# Patient Record
Sex: Male | Born: 1965 | Race: White | Hispanic: No | Smoking: Former smoker
Health system: Southern US, Community
[De-identification: ages and names within clinical notes are randomized; demographics above are authoritative.]

## PROBLEM LIST (undated history)

## (undated) DIAGNOSIS — M4846XA Fatigue fracture of vertebra, lumbar region, initial encounter for fracture: Secondary | ICD-10-CM

## (undated) DIAGNOSIS — I10 Essential (primary) hypertension: Secondary | ICD-10-CM

## (undated) HISTORY — PX: FRACTURE SURGERY: SHX138

---

## 2009-08-28 ENCOUNTER — Emergency Department: Payer: Self-pay | Admitting: Emergency Medicine

## 2009-08-28 ENCOUNTER — Ambulatory Visit: Payer: Self-pay | Admitting: Family Medicine

## 2016-05-30 ENCOUNTER — Ambulatory Visit
Admission: EM | Admit: 2016-05-30 | Discharge: 2016-05-30 | Disposition: A | Discharge status: Home / Self Care | Payer: Federal, State, Local not specified - PPO | Attending: Family Medicine | Admitting: Family Medicine

## 2016-05-30 DIAGNOSIS — J42 Unspecified chronic bronchitis: Secondary | ICD-10-CM

## 2016-05-30 DIAGNOSIS — J209 Acute bronchitis, unspecified: Secondary | ICD-10-CM | POA: Diagnosis not present

## 2016-05-30 DIAGNOSIS — I10 Essential (primary) hypertension: Secondary | ICD-10-CM

## 2016-05-30 HISTORY — DX: Fatigue fracture of vertebra, lumbar region, initial encounter for fracture: M48.46XA

## 2016-05-30 HISTORY — DX: Essential (primary) hypertension: I10

## 2016-05-30 LAB — BASIC METABOLIC PANEL
Anion gap: 7 (ref 5–15)
BUN: 12 mg/dL (ref 6–20)
CALCIUM: 8.8 mg/dL — AB (ref 8.9–10.3)
CO2: 25 mmol/L (ref 22–32)
CREATININE: 0.84 mg/dL (ref 0.61–1.24)
Chloride: 99 mmol/L — ABNORMAL LOW (ref 101–111)
GFR calc non Af Amer: >60 mL/min (ref >60)
Glucose, Bld: 110 mg/dL — ABNORMAL HIGH (ref 65–99)
Potassium: 4.1 mmol/L (ref 3.5–5.1)
SODIUM: 131 mmol/L — AB (ref 135–145)

## 2016-05-30 MED ORDER — AZITHROMYCIN 250 MG PO TABS: ORAL_TABLET | ORAL | 0 refills | Status: DC

## 2016-05-30 MED ORDER — CLONIDINE HCL 0.1 MG PO TABS
0.1000 mg | ORAL_TABLET | Freq: Once | ORAL | Status: AC
  Administered 2016-05-30: 0.1 mg via ORAL

## 2016-05-30 MED ORDER — ALBUTEROL SULFATE HFA 108 (90 BASE) MCG/ACT IN AERS: 1.0000 | INHALATION_SPRAY | Freq: Four times a day (QID) | RESPIRATORY_TRACT | 0 refills | Status: DC | PRN

## 2016-05-30 MED ORDER — LISINOPRIL 10 MG PO TABS
10.0000 mg | ORAL_TABLET | Freq: Every day | ORAL | 0 refills | Status: DC
Start: 2016-05-30 — End: 2020-08-17

## 2016-05-30 MED ORDER — PREDNISONE 20 MG PO TABS: ORAL_TABLET | ORAL | 0 refills | Status: DC

## 2016-05-30 NOTE — ED Provider Notes (Signed)
[facility]    CSN: [id number] Arrival date & time: 05/30/16  1406     History   Chief Complaint Chief Complaint  Patient presents with  . Cough    HPI Christopher Beck is a 51 y.o. male.   The history is provided by the patient.  URI  Presenting symptoms: cough and fatigue   Presenting symptoms: no congestion, no ear pain, no facial pain, no fever, no rhinorrhea and no sore throat   Severity:  Moderate Onset quality:  Sudden Duration:  8 days Timing:  Constant Progression:  Worsening Chronicity:  New Relieved by:  Nothing Ineffective treatments:  OTC medications Associated symptoms: wheezing   Associated symptoms: no headaches, no neck pain and no sinus pain   Risk factors: sick contacts   Risk factors: not elderly, no chronic cardiac disease, no chronic kidney disease, no diabetes mellitus, no immunosuppression, no recent illness and no recent travel  Chronic respiratory disease: chronic smoker.     Past Medical History:  Diagnosis Date  . Hypertension   . Lumbar stress fracture     There are no active problems to display for this patient.   Past Surgical History:  Procedure Laterality Date  . FRACTURE SURGERY         Home Medications    Prior to Admission medications   Medication Sig Start Date End Date Taking? Authorizing Provider  buprenorphine-naloxone (SUBOXONE) 8-2 mg SUBL SL tablet Place 1 tablet under the tongue 2 (two) times daily.   Yes Historical Provider, MD  albuterol (PROVENTIL HFA;VENTOLIN HFA) 108 (90 Base) MCG/ACT inhaler Inhale 1-2 puffs into the lungs every 6 (six) hours as needed for wheezing or shortness of breath. 05/30/16   Payton Mccallum, MD  azithromycin (ZITHROMAX Z-PAK) 250 MG tablet 2 tabs po once day 1, then 1 tab po qd for next 4 days 05/30/16   Payton Mccallum, MD  lisinopril (PRINIVIL,ZESTRIL) 10 MG tablet Take 1 tablet (10 mg total) by mouth daily. 05/30/16   Payton Mccallum, MD  predniSONE (DELTASONE) 20 MG tablet 2  tabs po qd for 5 days 05/30/16   Payton Mccallum, MD    Family History No family history on file.  Social History Social History  Substance Use Topics  . Smoking status: Current Every Day Smoker    Types: Cigarettes  . Smokeless tobacco: Never Used  . Alcohol use No     Allergies   Patient has no known allergies.   Review of Systems Review of Systems  Constitutional: Positive for fatigue. Negative for fever.  HENT: Negative for congestion, ear pain, rhinorrhea, sinus pain and sore throat.   Respiratory: Positive for cough and wheezing.   Musculoskeletal: Negative for neck pain.  Neurological: Negative for headaches.     Physical Exam Triage Vital Signs ED Triage Vitals  Enc Vitals Group     BP 05/30/16 1425 (!) 188/100     Pulse Rate 05/30/16 1425 96     Resp 05/30/16 1425 18     Temp 05/30/16 1425 98.8 F (37.1 C)     Temp Source 05/30/16 1425 Oral     SpO2 05/30/16 1425 96 %     Weight 05/30/16 1426 125 lb (56.7 kg)     Height 05/30/16 1426  (1.651 m)     Head Circumference --      Peak Flow --      Pain Score 05/30/16 1426 2     Pain Loc --  Pain Edu? --      Excl. in [place]? --    No data found.   Updated Vital Signs BP (!) 180/100 (BP Location: Right Arm)   Pulse 96   Temp 98.8 F (37.1 C) (Oral)   Resp 18   Ht  (1.651 m)   Wt 125 lb (56.7 kg)   SpO2 96%   BMI 20.80 kg/m   Visual Acuity Right Eye Distance:   Left Eye Distance:   Bilateral Distance:    Right Eye Near:   Left Eye Near:    Bilateral Near:     Physical Exam  Constitutional: He appears well-developed and well-nourished. No distress.  HENT:  Head: Normocephalic and atraumatic.  Right Ear: Tympanic membrane, external ear and ear canal normal.  Left Ear: Tympanic membrane, external ear and ear canal normal.  Nose: Nose normal.  Mouth/Throat: Uvula is midline, oropharynx is clear and moist and mucous membranes are normal. No oropharyngeal exudate or tonsillar  abscesses.  Eyes: Conjunctivae and EOM are normal. Pupils are equal, round, and reactive to light. Right eye exhibits no discharge. Left eye exhibits no discharge. No scleral icterus.  Neck: Normal range of motion. Neck supple. No tracheal deviation present. No thyromegaly present.  Cardiovascular: Normal rate, regular rhythm and normal heart sounds.   Pulmonary/Chest: Effort normal. No stridor. No respiratory distress. He has wheezes (diffuse, bilaterally as well as diffuse rhonchi). He has no rales. He exhibits no tenderness.  Lymphadenopathy:    He has no cervical adenopathy.  Neurological: He is alert.  Skin: Skin is warm and dry. No rash noted. He is not diaphoretic.  Nursing note and vitals reviewed.    UC Treatments / Results  Labs (all labs ordered are listed, but only abnormal results are displayed) Labs Reviewed  BASIC METABOLIC PANEL - Abnormal; Notable for the following:       Result Value   Sodium 131 (*)    Chloride 99 (*)    Glucose, Bld 110 (*)    Calcium 8.8 (*)    All other components within normal limits    EKG  EKG Interpretation None       Radiology No results found.  Procedures Procedures (including critical care time)  Medications Ordered in UC Medications  cloNIDine (CATAPRES) tablet 0.1 mg (0.1 mg Oral Given 05/30/16 1518)     Initial Impression / Assessment and Plan / UC Course  I have reviewed the triage vital signs and the nursing notes.  Pertinent labs & imaging results that were available during my care of the patient were reviewed by me and considered in my medical decision making (see chart for details).       Final Clinical Impressions(s) / UC Diagnoses   Final diagnoses:  Chronic bronchitis with acute exacerbation [facility])  Essential hypertension    New Prescriptions Discharge Medication List as of 05/30/2016  3:48 PM    START taking these medications   Details  albuterol (PROVENTIL HFA;VENTOLIN HFA) 108 (90 Base) MCG/ACT  inhaler Inhale 1-2 puffs into the lungs every 6 (six) hours as needed for wheezing or shortness of breath., Starting Fri 05/30/2016, Normal    azithromycin (ZITHROMAX Z-PAK) 250 MG tablet 2 tabs po once day 1, then 1 tab po qd for next 4 days, Normal    lisinopril (PRINIVIL,ZESTRIL) 10 MG tablet Take 1 tablet (10 mg total) by mouth daily., Starting Fri 05/30/2016, Normal    predniSONE (DELTASONE) 20 MG tablet 2 tabs po qd for  5 days, Normal       1. Lab results and diagnosis reviewed with patient 2. rx as per orders above; reviewed possible side effects, interactions, risks and benefits  3. Patient given clonidine 0.1mg  po x 1 4.  Recommend supportive treatment with rest, fluids 5. Follow-up prn if symptoms worsen or don't improve 6. Set up follow up appointment with PCP for recheck blood pressure   Payton Mccallum, MD 05/30/16 1621

## 2016-05-30 NOTE — ED Triage Notes (Signed)
Pt c/o cough with congestion for the past 8 days. Pt states he has a hx of HTN but does not go to the doctor regularly and is not on medication, pt is HTN on arrival.

## 2018-08-31 ENCOUNTER — Telehealth: Payer: Self-pay | Admitting: *Deleted

## 2018-08-31 ENCOUNTER — Encounter: Payer: Self-pay | Admitting: Emergency Medicine

## 2018-08-31 ENCOUNTER — Ambulatory Visit
Admission: EM | Admit: 2018-08-31 | Discharge: 2018-08-31 | Disposition: A | Discharge status: Home / Self Care | Payer: Federal, State, Local not specified - PPO

## 2018-08-31 ENCOUNTER — Other Ambulatory Visit: Payer: Self-pay

## 2018-08-31 DIAGNOSIS — Z20822 Contact with and (suspected) exposure to covid-19: Secondary | ICD-10-CM

## 2018-08-31 DIAGNOSIS — Z20828 Contact with and (suspected) exposure to other viral communicable diseases: Secondary | ICD-10-CM

## 2018-08-31 NOTE — ED Triage Notes (Signed)
Pt states that he was in contact with 2 people that tested positive for COVID. This occurred about 2 days ago. He denies no cough, SOB, fever. He is completely asymptomatic.

## 2018-08-31 NOTE — Discharge Instructions (Addendum)
It was very nice seeing you today in clinic. Thank you for entrusting me with your care.   Someone will contact you today via telephone to arrange for COVID testing. It will be at [facility]. Wear your mask and remain in your car when you arrive. They will come out to you. Self quarantine, per [place] DHHS  guidelines, until negative result received.   Again, it was my pleasure to take care of you today. Thank you for choosing our clinic.  Honor Loh, MSN, APRN, FNP-C, CEN Advanced Practice Provider [facility]

## 2018-08-31 NOTE — Telephone Encounter (Signed)
Pt scheduled today for covid testing @ 11:45 @ [facility]. Instructions given and order placed

## 2018-08-31 NOTE — Telephone Encounter (Signed)
-----   Message from Karen Kitchens, NP sent at 08/31/2018 10:23 AM EDT ----- Needs COVID testing. He is here with [place] at [facility] now. If you will call us at [phone], we will be happy to provide patient with appointment details/directives.   Honor Loh, MSN, APRN, FNP-C, CEN Advanced Practice Provider [facility]

## 2018-08-31 NOTE — ED Provider Notes (Signed)
Name: Christopher Beck DOB: 1965/06/27 MRN: [id number] CSN: [id number] PCP: Medicine, [facility]  Arrival date and time:  08/31/18 0955  Chief Complaint:  COVID exsposure (APPT)   NOTE: Prior to seeing the patient today, I have reviewed the triage nursing documentation and vital signs. Clinical staff has updated patient's PMH/PSHx, current medication list, and drug allergies/intolerances to ensure comprehensive history available to assist in medical decision making.   History:   HPI: Christopher Beck is a 53 y.o. male who presents today with requests for SARS-CoV-2 (novel coronavirus) testing following close, in-door, exposure to 2 individuals who tested positive for the virus. Patient is just finding out today of the positive results. Exposure occurred on Sunday (08/29/2018). He notes that he was not wearing a mask or any other form of personal protective equipment. Patient presents today asymptomatic. Patient states, "I feel fine other than being worried about being exposed". He has not experienced any recent upper respiratory symptoms. Triage VS indicate that patient is afebrile.   Past Medical History:  Diagnosis Date  . Hypertension   . Lumbar stress fracture     Past Surgical History:  Procedure Laterality Date  . FRACTURE SURGERY      Family History  Problem Relation Age of Onset  . Healthy Mother   . Healthy Father     There are no active problems to display for this patient.   Home Medications:    Current Meds  Medication Sig  . gabapentin (NEURONTIN) 300 MG capsule Take by mouth.  Marland Kitchen lisinopril (PRINIVIL,ZESTRIL) 10 MG tablet Take 1 tablet (10 mg total) by mouth daily.    Allergies:   Patient has no known allergies.  Review of Systems (ROS): Review of Systems  Constitutional: Negative for chills and fever.  HENT: Negative for congestion, rhinorrhea, sinus pain and sore throat.   Respiratory: Negative for cough and shortness of breath.    Cardiovascular: Negative for chest pain and palpitations.  Gastrointestinal: Negative for abdominal pain, diarrhea, nausea and vomiting.  Musculoskeletal: Negative for arthralgias, back pain, myalgias and neck pain.  Skin: Negative.   Neurological: Negative for dizziness, weakness and headaches.  Hematological: Negative for adenopathy.  Psychiatric/Behavioral: The patient is nervous/anxious (2/2 fear of COVID-19 exposure).      Triage Vital Signs: ED Triage Vitals  Enc Vitals Group     BP 08/31/18 1016 (!) 140/91     Pulse Rate 08/31/18 1016 70     Resp 08/31/18 1016 18     Temp 08/31/18 1016 98.2 F (36.8 C)     Temp Source 08/31/18 1016 Oral     SpO2 08/31/18 1016 100 %     Weight 08/31/18 1012 150 lb (68 kg)     Height 08/31/18 1012 5\' 7"  (1.702 m)     Head Circumference --      Peak Flow --      Pain Score 08/31/18 1012 0     Pain Loc --      Pain Edu? --      Excl. in [place]? --     Physical Exam: Physical Exam  Constitutional: He is oriented to person, place, and time and well-developed, well-nourished, and in no distress.  HENT:  Head: Normocephalic and atraumatic.  Mouth/Throat: Mucous membranes are normal.  Eyes: Pupils are equal, round, and reactive to light. EOM are normal.  Neck: Normal range of motion. Neck supple.  Cardiovascular: Normal rate, regular rhythm, normal heart sounds and intact distal pulses. Exam  reveals no gallop and no friction rub.  No murmur heard. Pulmonary/Chest: Effort normal and breath sounds normal. No respiratory distress. He has no wheezes. He has no rales.  Lymphadenopathy:    He has no cervical adenopathy.  Neurological: He is alert and oriented to person, place, and time. Gait normal. GCS score is 15.  Skin: Skin is warm and dry. No rash noted. No erythema.  Psychiatric: Mood, memory, affect and judgment normal.  Nursing note and vitals reviewed.   Urgent Care Treatments / Results:   LABS: PLEASE NOTE: all labs that were ordered  this encounter are listed, however only abnormal results are displayed. Labs Reviewed - No data to display  EKG: -None  RADIOLOGY: No results found.  PROCEDURES: Procedures  MEDICATIONS RECEIVED THIS VISIT: Medications - No data to display  PERTINENT CLINICAL COURSE NOTES/UPDATES:   Initial Impression / Assessment and Plan / Urgent Care Course:    Christopher Beck is a 53 y.o. male who [facility] with complaints of COVID exsposure (APPT)   Pertinent labs & imaging results that were available during my care of the patient were personally reviewed by me and considered in my medical decision making (see lab/imaging section of note for values and interpretations).  Patient overall well appearing and in no acute distress today in clinic. Exam is fully benign. Patient presents asymptomatic following close contact with 2 individuals known to have tested positive for SARS-CoV-2 (novel coronavirus). Patient is requesting testing. Given close unprotected exposure, his request is deemed reasonable. Test request sent prior to patient discharge. He was advised to expect call to schedule drive up testing today. Educated on testing process, including the fact that he will need to wear a mask and remain in his vehicle [facility]. He was encouraged to self quarantine, per [place] DH[place] guidelines, until negative test results received.  Further directives will be provided by testing staff at the time of scheduling.    I have reviewed the follow up and strict return precautions for any new symptoms. Patient is aware of symptoms that would be deemed urgent/emergent, and would thus require further evaluation either here or in the emergency department. At the time of discharge, he verbalized understanding and consent with the discharge plan as it was reviewed with him. All questions were fielded by provider and/or clinic staff prior to patient discharge.     Final Clinical Impressions(s) / Urgent Care Diagnoses:   Final diagnoses:  Exposure to Covid-19 Virus    New Prescriptions:  No orders of the defined types were placed in this encounter.   Controlled Substance Prescriptions:  [place] Controlled Substance Registry consulted? Not Applicable  NOTE: This note was prepared using Dragon dictation software along with smaller phrase technology. Despite my best ability to proofread, there is the potential that transcriptional errors may still occur from this process, and are completely unintentional.     Verlee MonteGray, Rachella Basden E, NP 08/31/18 2222

## 2018-09-04 LAB — NOVEL CORONAVIRUS, NAA: SARS-CoV-2, NAA: NOT DETECTED

## 2018-09-06 ENCOUNTER — Telehealth (HOSPITAL_COMMUNITY): Payer: Self-pay | Admitting: Emergency Medicine

## 2018-09-06 ENCOUNTER — Encounter (HOSPITAL_COMMUNITY): Payer: Self-pay

## 2018-09-06 NOTE — Telephone Encounter (Signed)
Your test for COVID-19 was negative.  Please continue good preventive care measures, including:  frequent hand-washing, avoid touching your face, cover coughs/sneezes, stay out of crowds and keep a 6 foot distance from others.  If you develop fever/cough/breathlessness, please stay home for 10 days and until you have had 3 consecutive days with cough/breathlessness improving and without fever (without taking a fever reducer). Go to the nearest hospital ED tent for assessment if fever/cough/breathlessness are severe or illness seems like a threat to life.  Attempted to reach patient. No answer at this time. Voicemail left.   Notified in Mychart.  

## 2020-08-13 ENCOUNTER — Other Ambulatory Visit: Payer: Self-pay

## 2020-08-13 ENCOUNTER — Emergency Department: Payer: Federal, State, Local not specified - PPO

## 2020-08-13 ENCOUNTER — Inpatient Hospital Stay
Admission: EM | Admit: 2020-08-13 | Discharge: 2020-08-17 | DRG: 394 | Disposition: A | Discharge status: Home / Self Care | Payer: Federal, State, Local not specified - PPO | Attending: Internal Medicine | Admitting: Internal Medicine

## 2020-08-13 DIAGNOSIS — I959 Hypotension, unspecified: Secondary | ICD-10-CM | POA: Diagnosis present

## 2020-08-13 DIAGNOSIS — G500 Trigeminal neuralgia: Secondary | ICD-10-CM | POA: Diagnosis present

## 2020-08-13 DIAGNOSIS — Z20822 Contact with and (suspected) exposure to covid-19: Secondary | ICD-10-CM | POA: Diagnosis present

## 2020-08-13 DIAGNOSIS — R066 Hiccough: Secondary | ICD-10-CM | POA: Diagnosis not present

## 2020-08-13 DIAGNOSIS — N400 Enlarged prostate without lower urinary tract symptoms: Secondary | ICD-10-CM | POA: Diagnosis present

## 2020-08-13 DIAGNOSIS — N179 Acute kidney failure, unspecified: Secondary | ICD-10-CM | POA: Diagnosis present

## 2020-08-13 DIAGNOSIS — Z87891 Personal history of nicotine dependence: Secondary | ICD-10-CM

## 2020-08-13 DIAGNOSIS — Z0000 Encounter for general adult medical examination without abnormal findings: Secondary | ICD-10-CM

## 2020-08-13 DIAGNOSIS — Z79899 Other long term (current) drug therapy: Secondary | ICD-10-CM | POA: Diagnosis not present

## 2020-08-13 DIAGNOSIS — E872 Acidosis, unspecified: Secondary | ICD-10-CM

## 2020-08-13 DIAGNOSIS — I10 Essential (primary) hypertension: Secondary | ICD-10-CM | POA: Diagnosis present

## 2020-08-13 DIAGNOSIS — Z955 Presence of coronary angioplasty implant and graft: Secondary | ICD-10-CM

## 2020-08-13 DIAGNOSIS — I2510 Atherosclerotic heart disease of native coronary artery without angina pectoris: Secondary | ICD-10-CM | POA: Diagnosis present

## 2020-08-13 DIAGNOSIS — K661 Hemoperitoneum: Secondary | ICD-10-CM | POA: Diagnosis not present

## 2020-08-13 DIAGNOSIS — I252 Old myocardial infarction: Secondary | ICD-10-CM | POA: Diagnosis not present

## 2020-08-13 DIAGNOSIS — D62 Acute posthemorrhagic anemia: Secondary | ICD-10-CM

## 2020-08-13 LAB — LACTIC ACID, PLASMA
Lactic Acid, Venous: 5.1 mmol/L (ref 0.5–1.9)
Lactic Acid, Venous: 3.5 mmol/L (ref 0.5–1.9)
Lactic Acid, Venous: 3.1 mmol/L (ref 0.5–1.9)
Lactic Acid, Venous: 3.3 mmol/L (ref 0.5–1.9)

## 2020-08-13 LAB — COMPREHENSIVE METABOLIC PANEL
ALT: 29 U/L (ref 0–44)
AST: 30 U/L (ref 15–41)
Albumin: 4.5 g/dL (ref 3.5–5.0)
Alkaline Phosphatase: 76 U/L (ref 38–126)
Anion gap: 13 (ref 5–15)
BUN: 15 mg/dL (ref 6–20)
CO2: 21 mmol/L — ABNORMAL LOW (ref 22–32)
Calcium: 9.1 mg/dL (ref 8.9–10.3)
Chloride: 101 mmol/L (ref 98–111)
Creatinine, Ser: 1.4 mg/dL — ABNORMAL HIGH (ref 0.61–1.24)
GFR, Estimated: 60 mL/min — ABNORMAL LOW (ref >60)
Glucose, Bld: 187 mg/dL — ABNORMAL HIGH (ref 70–99)
Potassium: 4.2 mmol/L (ref 3.5–5.1)
Sodium: 135 mmol/L (ref 135–145)
Total Bilirubin: 0.7 mg/dL (ref 0.3–1.2)
Total Protein: 7.2 g/dL (ref 6.5–8.1)

## 2020-08-13 LAB — CBC WITH DIFFERENTIAL/PLATELET
Abs Immature Granulocytes: 0.06 10*3/uL (ref 0.00–0.07)
Basophils Absolute: 0.1 10*3/uL (ref 0.0–0.1)
Basophils Relative: 0 %
Eosinophils Absolute: 0.1 10*3/uL (ref 0.0–0.5)
Eosinophils Relative: 1 %
HCT: 39.4 % (ref 39.0–52.0)
Hemoglobin: 13.3 g/dL (ref 13.0–17.0)
Immature Granulocytes: 0 %
Lymphocytes Relative: 14 %
Lymphs Abs: 2 10*3/uL (ref 0.7–4.0)
MCH: 29 pg (ref 26.0–34.0)
MCHC: 33.8 g/dL (ref 30.0–36.0)
MCV: 85.8 fL (ref 80.0–100.0)
Monocytes Absolute: 1.2 10*3/uL — ABNORMAL HIGH (ref 0.1–1.0)
Monocytes Relative: 8 %
Neutro Abs: 11.3 10*3/uL — ABNORMAL HIGH (ref 1.7–7.7)
Neutrophils Relative %: 77 %
Platelets: 271 10*3/uL (ref 150–400)
RBC: 4.59 MIL/uL (ref 4.22–5.81)
RDW: 18.6 % — ABNORMAL HIGH (ref 11.5–15.5)
WBC: 14.7 10*3/uL — ABNORMAL HIGH (ref 4.0–10.5)
nRBC: 0 % (ref 0.0–0.2)

## 2020-08-13 LAB — URINALYSIS, COMPLETE (UACMP) WITH MICROSCOPIC
Bacteria, UA: NONE SEEN
Bilirubin Urine: NEGATIVE
Glucose, UA: >=500 mg/dL — AB
Hgb urine dipstick: NEGATIVE
Ketones, ur: 5 mg/dL — AB
Leukocytes,Ua: NEGATIVE
Nitrite: NEGATIVE
Protein, ur: NEGATIVE mg/dL
Specific Gravity, Urine: >1.046 — ABNORMAL HIGH (ref 1.005–1.030)
Squamous Epithelial / HPF: NONE SEEN (ref 0–5)
pH: 6 (ref 5.0–8.0)

## 2020-08-13 LAB — RESP PANEL BY RT-PCR (FLU A&B, COVID) ARPGX2
Influenza A by PCR: NEGATIVE
Influenza B by PCR: NEGATIVE
SARS Coronavirus 2 by RT PCR: NEGATIVE

## 2020-08-13 LAB — SAMPLE TO BLOOD BANK

## 2020-08-13 LAB — HIV ANTIBODY (ROUTINE TESTING W REFLEX): HIV Screen 4th Generation wRfx: NONREACTIVE

## 2020-08-13 LAB — PROTIME-INR
INR: 1 (ref 0.8–1.2)
Prothrombin Time: 13.4 seconds (ref 11.4–15.2)

## 2020-08-13 LAB — HEMOGLOBIN AND HEMATOCRIT, BLOOD
HCT: 30.5 % — ABNORMAL LOW (ref 39.0–52.0)
HCT: 32.4 % — ABNORMAL LOW (ref 39.0–52.0)
Hemoglobin: 10.5 g/dL — ABNORMAL LOW (ref 13.0–17.0)
Hemoglobin: 10.6 g/dL — ABNORMAL LOW (ref 13.0–17.0)

## 2020-08-13 LAB — GLUCOSE, CAPILLARY: Glucose-Capillary: 112 mg/dL — ABNORMAL HIGH (ref 70–99)

## 2020-08-13 LAB — MRSA PCR SCREENING: MRSA by PCR: NEGATIVE

## 2020-08-13 IMAGING — CT CT CTA ABD/PEL W/CM AND/OR W/O CM
2 of 9 series · 12 of 46 positions shown, 17 images · IV contrast (omnipaque)
Comparison: None.

CLINICAL DATA: Abdominal pain, low back pain

EXAM:
CTA ABDOMEN AND PELVIS WITH CONTRAST
TECHNIQUE: Multidetector CT imaging of the abdomen and pelvis was performed
using the standard protocol during bolus administration of
intravenous contrast. Multiplanar reconstructed images and MIPs were
obtained and reviewed to evaluate the vascular anatomy.
CONTRAST:  100mL OMNIPAQUE IOHEXOL 350 MG/ML SOLN

[Series 6: axial venous · axial · portal-venous · 0.66mm/px · z∈[-1247,-847]mm · 10 of 241 slices shown, 15 images]
[im 21/241  soft-tissue]
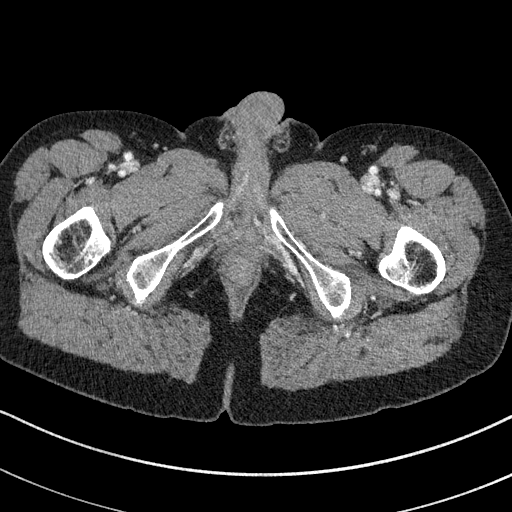
[im 21/241  bone]
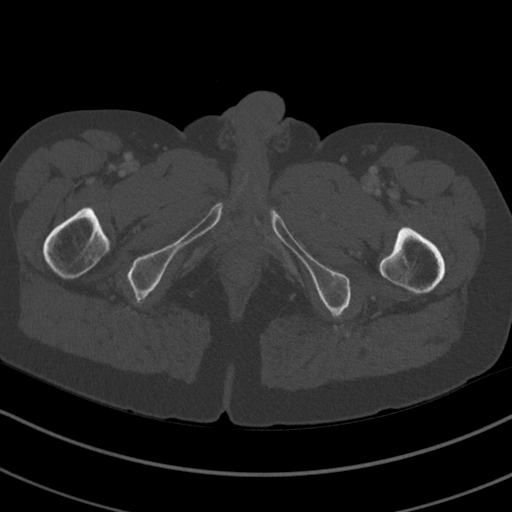
[im 41/241  soft-tissue]
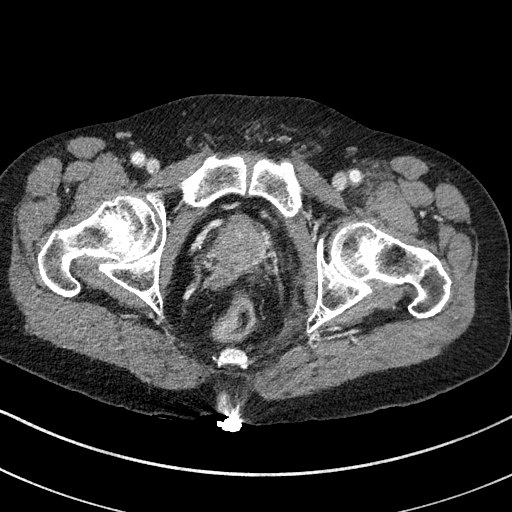
[im 81/241  soft-tissue]
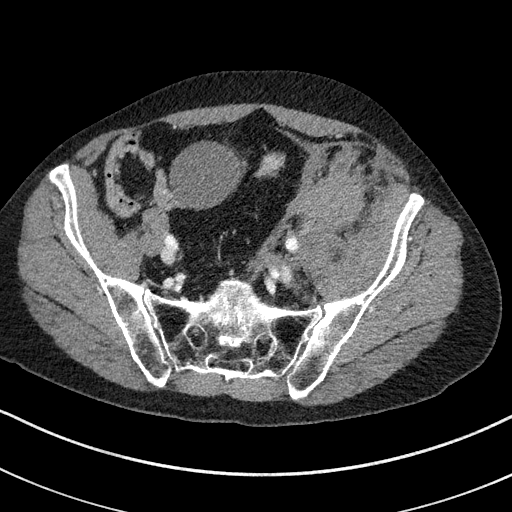
[im 101/241  soft-tissue]
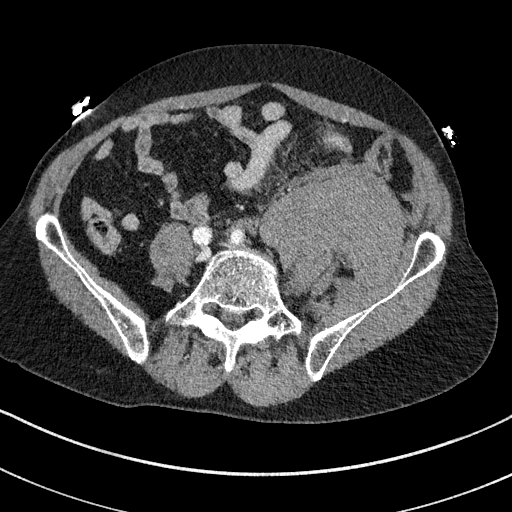
[im 121/241  soft-tissue]
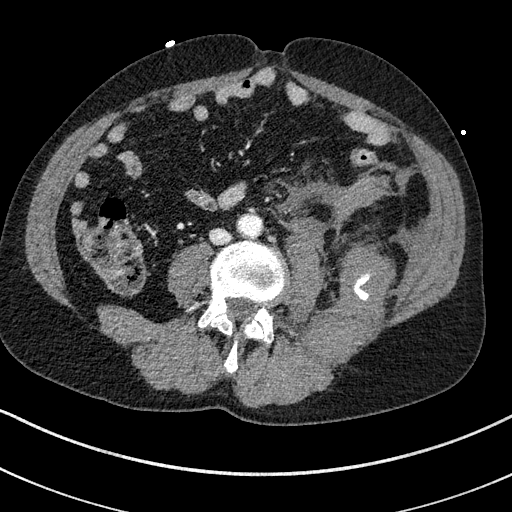
[im 141/241  soft-tissue]
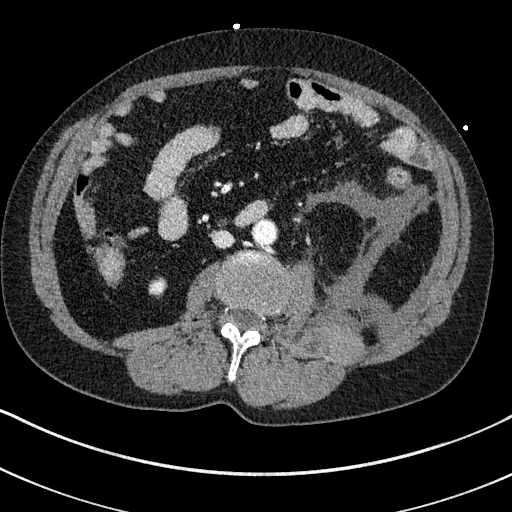
[im 161/241  soft-tissue]
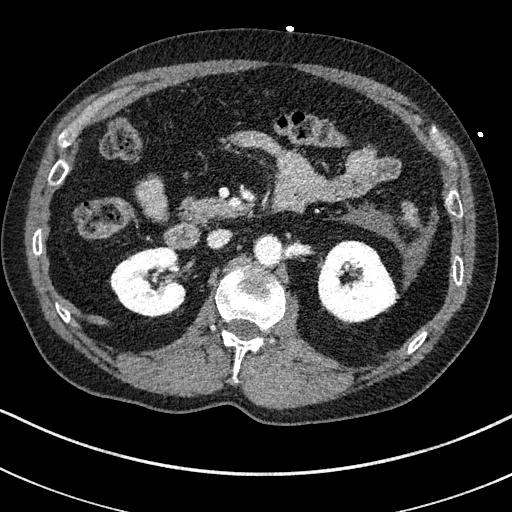
[im 161/241  lung]
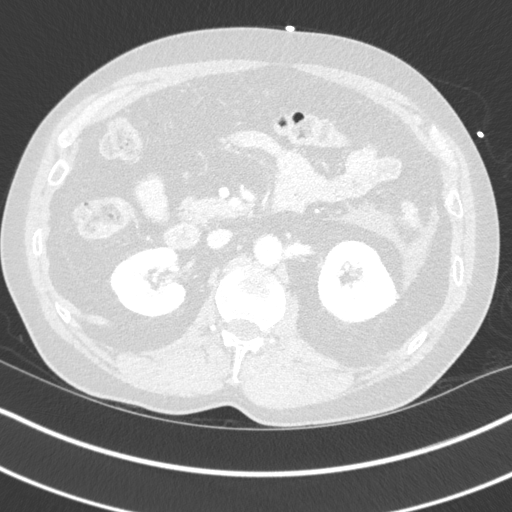
[im 181/241  lung]
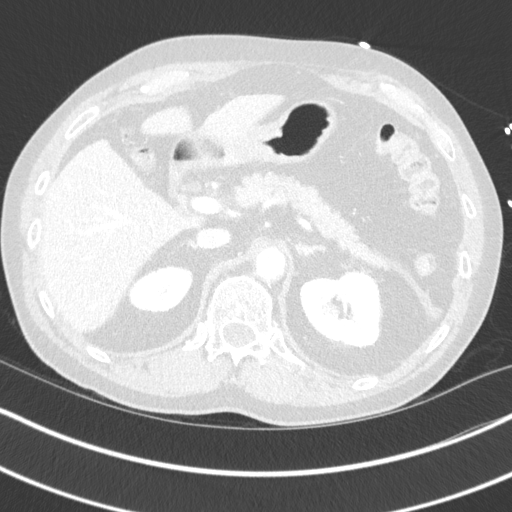
[im 201/241  soft-tissue]
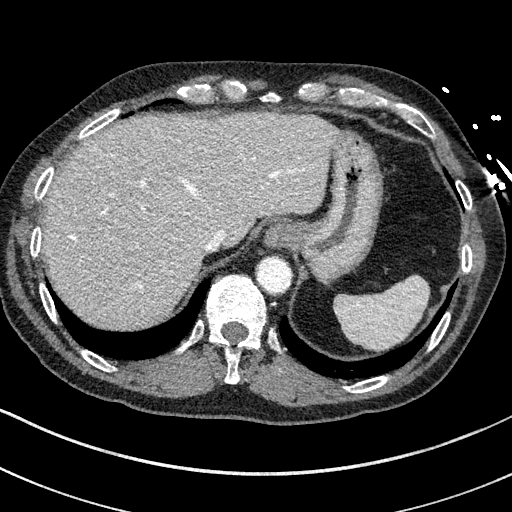
[im 201/241  lung]
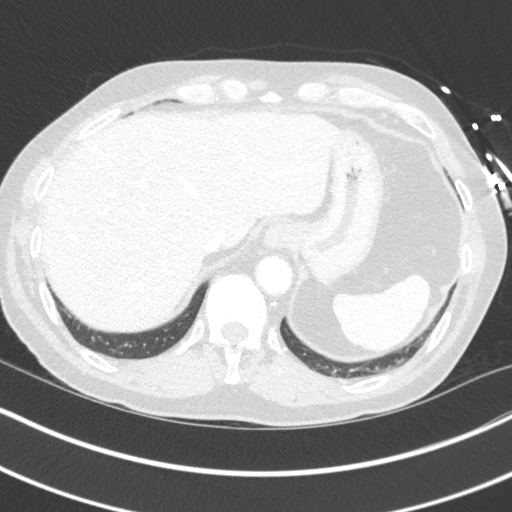
[im 221/241  soft-tissue]
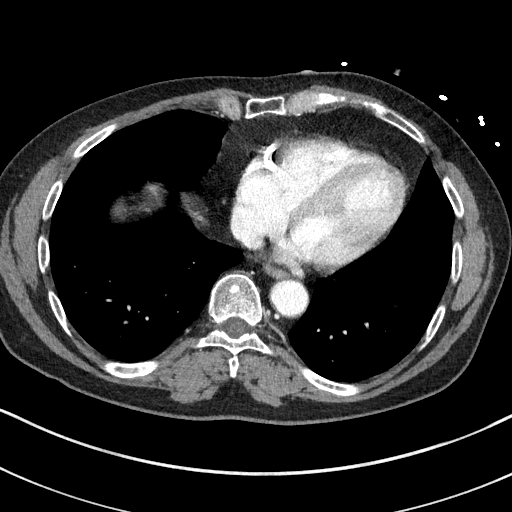
[im 221/241  lung]
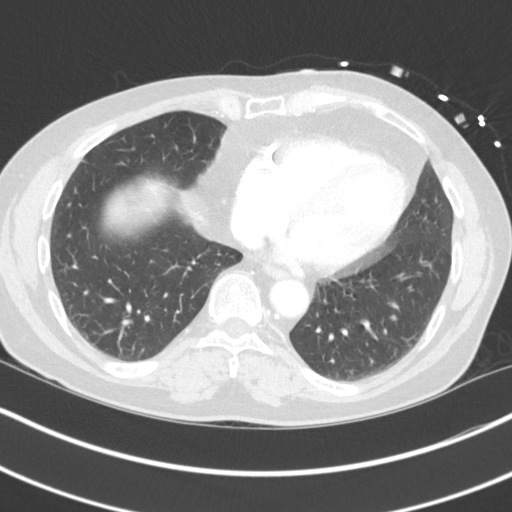
[im 221/241  bone]
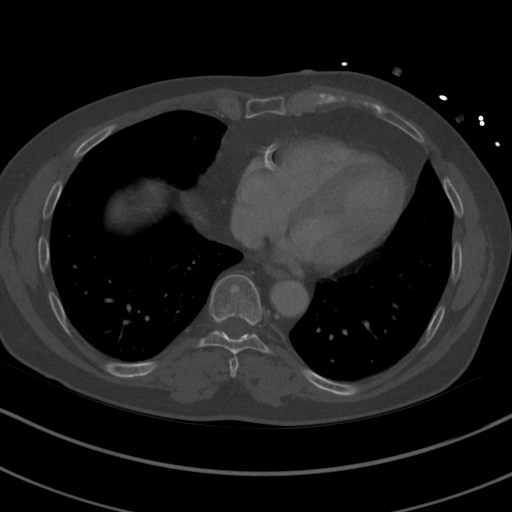

[Series 14: coronal venous mpr · coronal · portal-venous · 0.77mm/px · 2 of 129 slices shown]
[im 43/129  soft-tissue]
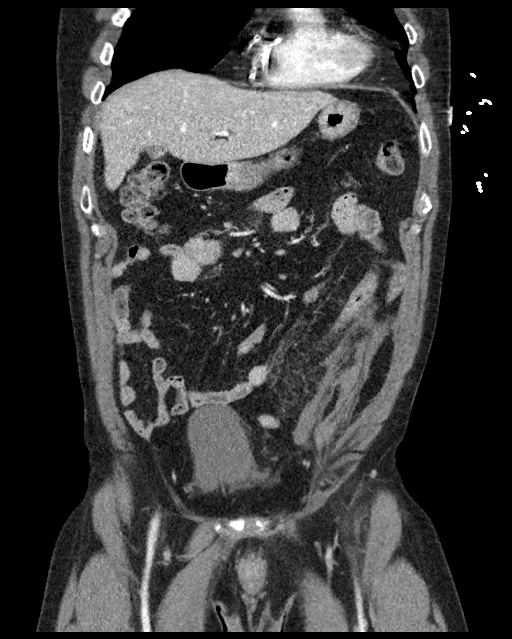
[im 86/129  soft-tissue]
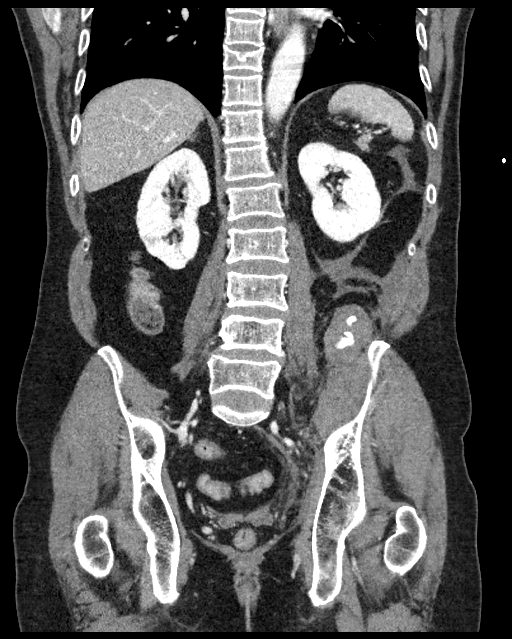

[12 of 46 positions shown; findings below may reference images not displayed]

FINDINGS: VASCULAR

Aorta: Minimal plaque in the infrarenal segment. No aneurysm,
dissection, or stenosis.

Celiac: Patent without evidence of aneurysm, dissection, vasculitis
or significant stenosis.

SMA: Patent without evidence of aneurysm, dissection, vasculitis or
significant stenosis.

Renals: Single left, widely patent. Single right, with calcified
ostial plaque resulting in stenosis over length of approximately
cm mild hemodynamic significance, patent distally.

IMA: Patent without evidence of aneurysm, dissection, vasculitis or
significant stenosis.

Inflow: Eccentric partially calcified plaque in the proximal left
common iliac artery resulting in approximately 50% diameter
short-segment stenosis.

Proximal Outflow: Bilateral common femoral and visualized portions
of the superficial and profunda femoral arteries are patent without
evidence of aneurysm, dissection, vasculitis or significant
stenosis.

Veins: Patent hepatic veins, portal vein, SMV, splenic vein,
bilateral renal veins, iliac venous system and IVC. There is tapered
narrowing of the left common iliac vein posterior to the left common
iliac artery.

There is active extravasation into a left retroperitoneal hematoma
from a small muscular branch.

Review of the MIP images confirms the above findings.

NON-VASCULAR

Lower chest: Coronary calcifications. No pleural or pericardial
effusion.

Hepatobiliary: Subcentimeter hyperdensity in the dependent aspect of
the nondilated gallbladder suggesting a partially calcified stone.
No focal liver lesion or biliary ductal dilatation.

Pancreas: Unremarkable. No pancreatic ductal dilatation or
surrounding inflammatory changes.

Spleen: Normal in size without focal abnormality.

Adrenals/Urinary Tract: Adrenal glands are unremarkable. Kidneys are
normal, without renal calculi, focal lesion, or hydronephrosis.
Bladder is unremarkable.

Stomach/Bowel: Stomach is nondistended. Small bowel decompressed.
Normal appendix. Colon is nondilated, unremarkable.

Lymphatic: No abdominal or pelvic adenopathy.

Reproductive: Mild prostate enlargement.

Other: Left retroperitoneal hematoma measuring approximately 9.7 x
7.3 cm maximum transverse dimensions, 7.2 cm craniocaudal. Active
extravasation from a small muscular branch is noted at the posterior
aspect of the collection in the iliac fossa ([2P],Se4) . no
ascites. No free air.

Musculoskeletal: No acute or significant osseous findings.
IMPRESSION: 1. 9.7 cm left retroperitoneal hematoma that active extravasation
from a small muscular branch.
2. Aortic Atherosclerosis ([2P]-170.0) without aneurysm,
dissection, or stenosis.
3. Cholelithiasis

## 2020-08-13 MED ORDER — HYDROMORPHONE HCL 1 MG/ML IJ SOLN
0.5000 mg | Freq: Once | INTRAMUSCULAR | Status: AC
Start: 2020-08-13 — End: 2020-08-13
  Administered 2020-08-13: 0.5 mg via INTRAVENOUS
  Filled 2020-08-13: qty 1

## 2020-08-13 MED ORDER — HYDROMORPHONE HCL 1 MG/ML IJ SOLN
1.0000 mg | INTRAMUSCULAR | Status: DC | PRN
  Administered 2020-08-13 – 2020-08-17 (×22): 1 mg via INTRAVENOUS
  Filled 2020-08-13 (×23): qty 1

## 2020-08-13 MED ORDER — ONDANSETRON HCL 4 MG/2ML IJ SOLN
4.0000 mg | Freq: Once | INTRAMUSCULAR | Status: AC
  Administered 2020-08-13: 4 mg via INTRAVENOUS
  Filled 2020-08-13: qty 2

## 2020-08-13 MED ORDER — SODIUM CHLORIDE 0.9 % IV BOLUS
1000.0000 mL | Freq: Once | INTRAVENOUS | Status: AC
  Administered 2020-08-13: 1000 mL via INTRAVENOUS

## 2020-08-13 MED ORDER — MORPHINE SULFATE (PF) 4 MG/ML IV SOLN
6.0000 mg | Freq: Once | INTRAVENOUS | Status: AC
Start: 2020-08-13 — End: 2020-08-13
  Administered 2020-08-13: 6 mg via INTRAVENOUS
  Filled 2020-08-13: qty 2

## 2020-08-13 MED ORDER — FENTANYL CITRATE (PF) 100 MCG/2ML IJ SOLN
75.0000 ug | Freq: Once | INTRAMUSCULAR | Status: AC
  Administered 2020-08-13: 75 ug via INTRAVENOUS
  Filled 2020-08-13: qty 2

## 2020-08-13 MED ORDER — SODIUM CHLORIDE 0.9 % IV SOLN: Freq: Once | INTRAVENOUS | Status: AC

## 2020-08-13 MED ORDER — CHLORHEXIDINE GLUCONATE CLOTH 2 % EX PADS
6.0000 | MEDICATED_PAD | Freq: Every day | CUTANEOUS | Status: DC
  Administered 2020-08-13 – 2020-08-17 (×5): 6 via TOPICAL
  Filled 2020-08-13: qty 6

## 2020-08-13 MED ORDER — ONDANSETRON HCL 4 MG/2ML IJ SOLN
4.0000 mg | Freq: Four times a day (QID) | INTRAMUSCULAR | Status: DC | PRN
  Administered 2020-08-13 – 2020-08-14 (×3): 4 mg via INTRAVENOUS
  Filled 2020-08-13 (×4): qty 2

## 2020-08-13 MED ORDER — ACETAMINOPHEN 500 MG PO TABS
1000.0000 mg | ORAL_TABLET | Freq: Once | ORAL | Status: AC
  Administered 2020-08-13: 1000 mg via ORAL
  Filled 2020-08-13: qty 2

## 2020-08-13 MED ORDER — IOHEXOL 350 MG/ML SOLN
100.0000 mL | Freq: Once | INTRAVENOUS | Status: AC | PRN
  Administered 2020-08-13: 100 mL via INTRAVENOUS

## 2020-08-13 MED ORDER — METHOCARBAMOL 750 MG PO TABS
750.0000 mg | ORAL_TABLET | Freq: Once | ORAL | Status: AC
  Administered 2020-08-13: 750 mg via ORAL
  Filled 2020-08-13: qty 1

## 2020-08-13 MED ORDER — ONDANSETRON HCL 4 MG PO TABS: 4.0000 mg | ORAL_TABLET | Freq: Four times a day (QID) | ORAL | Status: DC | PRN

## 2020-08-13 MED ORDER — SODIUM CHLORIDE 0.9 % IV SOLN: INTRAVENOUS | Status: DC

## 2020-08-13 MED ORDER — METHOCARBAMOL 1000 MG/10ML IJ SOLN
500.0000 mg | Freq: Four times a day (QID) | INTRAVENOUS | Status: DC | PRN
  Filled 2020-08-13 (×3): qty 5

## 2020-08-13 NOTE — ED Notes (Signed)
Informed RN bed assigned 

## 2020-08-13 NOTE — Progress Notes (Addendum)
   1555:Patient has not voided since admission. Bladder scan showed 539 cc. Encouraged in/out cath per protocol. Patient declined at this time. Will reevaluate in one hour if patient has not voided.   1648: Edited to add- patient voided 350cc

## 2020-08-13 NOTE — H&P (Signed)
History and Physical    Christopher Beck WUJ:[id number] DOB: Jan 17, 1966 DOA: 08/13/2020  PCP: Laurine Blazer, MD   Patient coming from: Home  I have personally briefly reviewed patient's old medical records in [facility]  Chief Complaint: Back pain  HPI: Christopher Beck is a 55 y.o. male with medical history significant for trigeminal neuralgia, status post recent left radiofrequency ablation for trigeminal neuralgia (05/03) coronary artery disease status post acute ST elevation MI with complications of complete heart block/V. Fib/V. tach requiring CPR/intubation and a temporary pacemaker placement, status post PCI with DES to RCA and mid LAD, history of hypertension who presents to the emergency room for evaluation of back pain mostly left-sided flank and left lower abdominal pain with radiation to his groin.  Patient denies having any trauma and states that he had stretched in bed after which he developed severe pain in his back mostly left-sided and rated 10 x 10 in intensity at its worst.  Patient is writhing in bed and cannot get comfortable despite receiving multiple doses of pain medications which included fentanyl, morphine and Dilaudid. He denies having any chest pain, no fever, no chills, no dizziness, no lightheadedness, no nausea, no vomiting, no urinary symptoms, no changes in his bowel habits, no palpitations or diaphoresis. Labs show sodium 135, potassium 4.2, chloride 101, bicarb 21, glucose 187, BUN 15, creatinine 1.40, calcium 9.1, alkaline phosphatase 76, albumin 4.5, AST 30, ALT 29, total protein 7.2, lactic acid 5.1, white count 14.7, hemoglobin 13.3 << 10.5, hematocrit 39.4, MCV 85.8, RDW 18.6, platelet count 271 PT 13.4, INR 1.0 Respiratory viral panel is negative CT angiogram of abdomen and pelvis shows  9.7 cm left retroperitoneal hematoma that active extravasation from a small muscular branch. Aortic Atherosclerosis (ICD10-170.0) without aneurysm, dissection, or  stenosis. Cholelithiasis     ED Course: Patient is a 55 year old male with a history of coronary artery disease status post stent angioplasty with DES to RCA and mid LAD on dual antiplatelet therapy who presents to the ER for evaluation of severe back pain after he stretched in bed.  Patient rates his pain a 10 x 10 in intensity at its worst with radiation to the groin. He was hypotensive when he arrived to ER with systolic blood pressure in the 80s and lactic acid of 5.1. He received 3 L IV fluid bolus with improvement in his blood pressure and also received fentanyl, morphine and hydromorphone for pain control. CT angiogram shows a 9.7 cm left retroperitoneal hematoma that active extravasation from a small muscular branch. Vascular surgery was consulted in the ER and he recommended conservative management for now as the vessel was too small for any meaningful intervention. He recommends pain control and to monitor serial H&H. Patient will be admitted to the stepdown unit for further evaluation.      Review of Systems: As per HPI otherwise all other systems reviewed and negative.    Past Medical History:  Diagnosis Date   Hypertension    Lumbar stress fracture     Past Surgical History:  Procedure Laterality Date   FRACTURE SURGERY       reports that he quit smoking about 2 years ago. His smoking use included cigarettes. He has never used smokeless tobacco. He reports previous drug use. He reports that he does not drink alcohol.  No Known Allergies  Family History  Problem Relation Age of Onset   Healthy Mother    Healthy Father  Prior to Admission medications   Medication Sig Start Date End Date Taking? Authorizing Provider  gabapentin (NEURONTIN) 300 MG capsule Take by mouth. 07/12/18 07/12/19  [provider]  lisinopril (PRINIVIL,ZESTRIL) 10 MG tablet Take 1 tablet (10 mg total) by mouth daily. 05/30/16   Payton Mccallum, MD  albuterol (PROVENTIL  HFA;VENTOLIN HFA) 108 (90 Base) MCG/ACT inhaler Inhale 1-2 puffs into the lungs every 6 (six) hours as needed for wheezing or shortness of breath. 05/30/16 08/31/18  Payton Mccallum, MD    Physical Exam: Vitals:   08/13/20 1445 08/13/20 1500 08/13/20 1513 08/13/20 1515  BP: 109/73 110/80 109/76 113/75  Pulse:   82   Resp: (!) 23 (!) 21 18 19   Temp:      SpO2:   94%      Vitals:   08/13/20 1445 08/13/20 1500 08/13/20 1513 08/13/20 1515  BP: 109/73 110/80 109/76 113/75  Pulse:   82   Resp: (!) 23 (!) 21 18 19   Temp:      SpO2:   94%       Constitutional: Alert and oriented x 3 .  Appears to be in severe painful distress , patient is writhing in pain and unable to get comfortable.   HEENT:      Head: Normocephalic and atraumatic.         Eyes: PERLA, EOMI, Conjunctivae are normal. Sclera is non-icteric.       Mouth/Throat: Mucous membranes are moist.       Neck: Supple with no signs of meningismus. Cardiovascular: Regular rate and rhythm. No murmurs, gallops, or rubs. 2+ symmetrical distal pulses are present . No JVD. No LE edema Respiratory: Respiratory effort normal .Lungs sounds clear bilaterally. No wheezes, crackles, or rhonchi.  Gastrointestinal: Firm, diffusely tender, and slightly distended with positive bowel sounds.  Genitourinary: No CVA tenderness. Musculoskeletal: Nontender with normal range of motion in all extremities. No cyanosis, or erythema of extremities. Neurologic:  Face is symmetric. Moving all extremities. No gross focal neurologic deficits . Skin: Skin is warm, dry.  No rash or ulcers Psychiatric: Mood and affect are normal    Labs on Admission: I have personally reviewed following labs and imaging studies  CBC: Recent Labs  Lab 08/13/20 1258 08/13/20 1509  WBC 14.7*  --   NEUTROABS 11.3*  --   HGB 13.3 10.5*  HCT 39.4 30.5*  MCV 85.8  --   PLT 271  --    Basic Metabolic Panel: Recent Labs  Lab 08/13/20 1258  NA 135  K 4.2  CL 101  CO2  21*  GLUCOSE 187*  BUN 15  CREATININE 1.40*  CALCIUM 9.1   GFR: CrCl cannot be calculated (Unknown ideal weight.). Liver Function Tests: Recent Labs  Lab 08/13/20 1258  AST 30  ALT 29  ALKPHOS 76  BILITOT 0.7  PROT 7.2  ALBUMIN 4.5   No results for input(s): LIPASE, AMYLASE in the last 168 hours. No results for input(s): AMMONIA in the last 168 hours. Coagulation Profile: Recent Labs  Lab 08/13/20 1258  INR 1.0   Cardiac Enzymes: No results for input(s): CKTOTAL, CKMB, CKMBINDEX, TROPONINI in the last 168 hours. BNP (last 3 results) No results for input(s): PROBNP in the last 8760 hours. HbA1C: No results for input(s): HGBA1C in the last 72 hours. CBG: No results for input(s): GLUCAP in the last 168 hours. Lipid Profile: No results for input(s): CHOL, HDL, LDLCALC, TRIG, CHOLHDL, LDLDIRECT in the last 72 hours. Thyroid Function Tests:  No results for input(s): TSH, T4TOTAL, FREET4, T3FREE, THYROIDAB in the last 72 hours. Anemia Panel: No results for input(s): VITAMINB12, FOLATE, FERRITIN, TIBC, IRON, RETICCTPCT in the last 72 hours. Urine analysis: No results found for: COLORURINE, APPEARANCEUR, LABSPEC, PHURINE, GLUCOSEU, HGBUR, BILIRUBINUR, KETONESUR, PROTEINUR, UROBILINOGEN, NITRITE, LEUKOCYTESUR  Radiological Exams on Admission: CT Angio Abd/Pel W and/or Wo Contrast  Result Date: 08/13/2020 CLINICAL DATA:  Abdominal pain, low back pain EXAM: CTA ABDOMEN AND PELVIS WITH CONTRAST TECHNIQUE: Multidetector CT imaging of the abdomen and pelvis was performed using the standard protocol during bolus administration of intravenous contrast. Multiplanar reconstructed images and MIPs were obtained and reviewed to evaluate the vascular anatomy. CONTRAST:  100mL OMNIPAQUE IOHEXOL 350 MG/ML SOLN COMPARISON:  None. FINDINGS: VASCULAR Aorta: Minimal plaque in the infrarenal segment. No aneurysm, dissection, or stenosis. Celiac: Patent without evidence of aneurysm, dissection,  vasculitis or significant stenosis. SMA: Patent without evidence of aneurysm, dissection, vasculitis or significant stenosis. Renals: Single left, widely patent. Single right, with calcified ostial plaque resulting in stenosis over length of approximately 1.6 cm mild hemodynamic significance, patent distally. IMA: Patent without evidence of aneurysm, dissection, vasculitis or significant stenosis. Inflow: Eccentric partially calcified plaque in the proximal left common iliac artery resulting in approximately 50% diameter short-segment stenosis. Proximal Outflow: Bilateral common femoral and visualized portions of the superficial and profunda femoral arteries are patent without evidence of aneurysm, dissection, vasculitis or significant stenosis. Veins: Patent hepatic veins, portal vein, SMV, splenic vein, bilateral renal veins, iliac venous system and IVC. There is tapered narrowing of the left common iliac vein posterior to the left common iliac artery. There is active extravasation into a left retroperitoneal hematoma from a small muscular branch. Review of the MIP images confirms the above findings. NON-VASCULAR Lower chest: Coronary calcifications. No pleural or pericardial effusion. Hepatobiliary: Subcentimeter hyperdensity in the dependent aspect of the nondilated gallbladder suggesting a partially calcified stone. No focal liver lesion or biliary ductal dilatation. Pancreas: Unremarkable. No pancreatic ductal dilatation or surrounding inflammatory changes. Spleen: Normal in size without focal abnormality. Adrenals/Urinary Tract: Adrenal glands are unremarkable. Kidneys are normal, without renal calculi, focal lesion, or hydronephrosis. Bladder is unremarkable. Stomach/Bowel: Stomach is nondistended. Small bowel decompressed. Normal appendix. Colon is nondilated, unremarkable. Lymphatic: No abdominal or pelvic adenopathy. Reproductive: Mild prostate enlargement. Other: Left retroperitoneal hematoma measuring  approximately 9.7 x 7.3 cm maximum transverse dimensions, 7.2 cm craniocaudal. Active extravasation from a small muscular branch is noted at the posterior aspect of the collection in the iliac fossa (Im129,Se4) . no ascites. No free air. Musculoskeletal: No acute or significant osseous findings. IMPRESSION: 1. 9.7 cm left retroperitoneal hematoma that active extravasation from a small muscular branch. 2. Aortic Atherosclerosis (ICD10-170.0) without aneurysm, dissection, or stenosis. 3. Cholelithiasis Electronically Signed   By: Corlis Leak  Hassell M.D.   On: 08/13/2020 14:18     Assessment/Plan Principal Problem:   Nontraumatic retroperitoneal hematoma Active Problems:   CAD (coronary artery disease)   Trigeminal neuralgia of left side of face      Nontraumatic retroperitoneal hematoma Patient presents to the emergency room for evaluation of severe left flank and left lower abdominal pain. He denies any trauma and was hypotensive when he arrived with systolic blood pressure in the 80s and lactic acidosis. Patient received 3 L IV fluid bolus with improvement in his blood pressure CT angiogram showed a 9.7 cm left retroperitoneal hematoma with active extravasation from a small muscular branch. Retroperitoneal hematoma appears to be secondary to Effient use, which patient  has been on following his stent angioplasty Vascular surgery has been consulted and recommend conservative management Patient already has a 3 g drop in his H&H Check serial H&H and transfuse as needed Pain control IV fluid resuscitation    History of coronary artery disease Status post PCI with stent angioplasty Hold Effient and aspirin    History of trigeminal neuralgia Status post left radiofrequency ablation Stable   DVT prophylaxis: SCD  Code Status: full code  Family Communication: 50% of time was spent discussing patient's condition and plan of care with himat the bedside.  All questions and concerns have been  addressed.  He verbalizes understanding and agrees with the plan. Disposition Plan: Back to previous home environment Consults called: Vascular surgery Status: At the time of admission, it appears that the appropriate admission status for this patient is inpatient. This is judged to be reasonable and necessary in order to provide the required intensity of service to ensure the patient's safety given the presenting symptoms, physical exam findings, and initial radiographic and laboratory data in the context of their comorbid conditions.    Lucile Shutters MD [facility]     08/13/2020, 3:57 PM

## 2020-08-13 NOTE — ED Notes (Signed)
Patient transported to CT 

## 2020-08-13 NOTE — Consult Note (Signed)
[facility] Vascular Consult Note  MRN : [id number]  Christopher Beck is a 55 y.o. ([birth year]) male who presents with chief complaint of  Chief Complaint  Patient presents with   Back Pain  .  History of Present Illness: I am asked to see the patient by Dr. Erma Heritage for a left retroperitoneal hematoma.  The patient began having pain this morning spontaneously.  No recent trauma, surgery, or injuries.  He had facial surgery over a month ago.  He has a previous history of coronary disease and had a heart attack with coronary intervention in October of last year.  He is on Effient.  He is in 10 out of 10 pain.  He is writhing in pain on the bed.  He had a CT scan of the abdomen pelvis which I have independently reviewed.  This does demonstrate a significant size left retroperitoneal hematoma.  Although there is a muscular branch that seems to be feeding the hematoma, this is quite small.    Current Facility-Administered Medications  Medication Dose Route Frequency Provider Last Rate Last Admin   sodium chloride 0.9 % bolus 1,000 mL  1,000 mL Intravenous Once Shaune Pollack, MD       Current Outpatient Medications  Medication Sig Dispense Refill   gabapentin (NEURONTIN) 300 MG capsule Take by mouth.     lisinopril (PRINIVIL,ZESTRIL) 10 MG tablet Take 1 tablet (10 mg total) by mouth daily. 10 tablet 0    Past Medical History:  Diagnosis Date   Hypertension    Lumbar stress fracture     Past Surgical History:  Procedure Laterality Date   FRACTURE SURGERY      Social History   Tobacco Use   Smoking status: Former    Pack years: 0.00    Types: Cigarettes    Quit date: 04/01/2018    Years since quitting: 2.3   Smokeless tobacco: Never  Vaping Use   Vaping Use: Some days  Substance Use Topics   Alcohol use: No   Drug use: Not Currently     Family History  Problem Relation Age of Onset   Healthy Mother    Healthy Father   No bleeding disorders,  clotting disorders, or aneurysms  No Known Allergies   REVIEW OF SYSTEMS (Negative unless checked)  Constitutional: [] Weight loss  [] Fever  [] Chills Cardiac: [] Chest pain   [] Chest pressure   [] Palpitations   [] Shortness of breath when laying flat   [] Shortness of breath at rest   [] Shortness of breath with exertion. Vascular:  [] Pain in legs with walking   [] Pain in legs at rest   [] Pain in legs when laying flat   [] Claudication   [] Pain in feet when walking  [] Pain in feet at rest  [] Pain in feet when laying flat   [] History of DVT   [] Phlebitis   [] Swelling in legs   [] Varicose veins   [] Non-healing ulcers Pulmonary:   [] Uses home oxygen   [] Productive cough   [] Hemoptysis   [] Wheeze  [] COPD   [] Asthma Neurologic:  [] Dizziness  [] Blackouts   [] Seizures   [] History of stroke   [] History of TIA  [] Aphasia   [] Temporary blindness   [] Dysphagia   [] Weakness or numbness in arms   [] Weakness or numbness in legs Musculoskeletal:  [] Arthritis   [] Joint swelling   [] Joint pain   [x] Low back pain Hematologic:  [] Easy bruising  [] Easy bleeding   [] Hypercoagulable state   [] Anemic  [] Hepatitis Gastrointestinal:  [] Blood  in stool   [] Vomiting blood  [x] Gastroesophageal reflux/heartburn   [] Difficulty swallowing. Genitourinary:  [] Chronic kidney disease   [] Difficult urination  [] Frequent urination  [] Burning with urination   [] Blood in urine Skin:  [] Rashes   [] Ulcers   [] Wounds Psychological:  [] History of anxiety   []  History of major depression.  Physical Examination  Vitals:   08/13/20 1405 08/13/20 1415 08/13/20 1430 08/13/20 1445  BP: (!) 116/99 (!) 108/95 117/78 109/73  Pulse: 80     Resp: (!) 27 (!) 22  (!) 23  Temp:      SpO2: 100%      There is no height or weight on file to calculate BMI. Gen:  WD/WN, NAD Head: [place]/AT, No temporalis wasting.  Ear/Nose/Throat: Hearing grossly intact, nares w/o erythema or drainage, oropharynx w/o Erythema/Exudate Eyes: Sclera non-icteric, conjunctiva  clear Neck: Trachea midline.  No JVD.  Pulmonary:  Good air movement, respirations not labored, equal bilaterally.  Cardiac: RRR, normal S1, S2. Vascular:  Vessel Right Left  Radial Palpable Palpable                                   Gastrointestinal: Somewhat distended and quite tender to palpation particularly on the left side Musculoskeletal: M/S 5/5 throughout.  Extremities without ischemic changes.  No deformity or atrophy. No edema. Neurologic: Sensation grossly intact in extremities.  Symmetrical.  Speech is fluent. Motor exam as listed above. Psychiatric: Judgment intact, Mood & affect appropriate for pt's clinical situation. Dermatologic: No rashes or ulcers noted.  No cellulitis or open wounds.      CBC Lab Results  Component Value Date   WBC 14.7 (H) 08/13/2020   HGB 13.3 08/13/2020   HCT 39.4 08/13/2020   MCV 85.8 08/13/2020   PLT 271 08/13/2020    BMET    Component Value Date/Time   NA 135 08/13/2020 1258   K 4.2 08/13/2020 1258   CL 101 08/13/2020 1258   CO2 21 (L) 08/13/2020 1258   GLUCOSE 187 (H) 08/13/2020 1258   BUN 15 08/13/2020 1258   CREATININE 1.40 (H) 08/13/2020 1258   CALCIUM 9.1 08/13/2020 1258   GFRNONAA 60 (L) 08/13/2020 1258   GFRAA >60 05/30/2016 1517   CrCl cannot be calculated (Unknown ideal weight.).  COAG No results found for: INR, PROTIME  Radiology CT Angio Abd/Pel W and/or Wo Contrast  Result Date: 08/13/2020 CLINICAL DATA:  Abdominal pain, low back pain EXAM: CTA ABDOMEN AND PELVIS WITH CONTRAST TECHNIQUE: Multidetector CT imaging of the abdomen and pelvis was performed using the standard protocol during bolus administration of intravenous contrast. Multiplanar reconstructed images and MIPs were obtained and reviewed to evaluate the vascular anatomy. CONTRAST:  08/15/2020 OMNIPAQUE IOHEXOL 350 MG/ML SOLN COMPARISON:  None. FINDINGS: VASCULAR Aorta: Minimal plaque in the infrarenal segment. No aneurysm, dissection, or stenosis.  Celiac: Patent without evidence of aneurysm, dissection, vasculitis or significant stenosis. SMA: Patent without evidence of aneurysm, dissection, vasculitis or significant stenosis. Renals: Single left, widely patent. Single right, with calcified ostial plaque resulting in stenosis over length of approximately 1.6 cm mild hemodynamic significance, patent distally. IMA: Patent without evidence of aneurysm, dissection, vasculitis or significant stenosis. Inflow: Eccentric partially calcified plaque in the proximal left common iliac artery resulting in approximately 50% diameter short-segment stenosis. Proximal Outflow: Bilateral common femoral and visualized portions of the superficial and profunda femoral arteries are patent without evidence of aneurysm, dissection, vasculitis or  significant stenosis. Veins: Patent hepatic veins, portal vein, SMV, splenic vein, bilateral renal veins, iliac venous system and IVC. There is tapered narrowing of the left common iliac vein posterior to the left common iliac artery. There is active extravasation into a left retroperitoneal hematoma from a small muscular branch. Review of the MIP images confirms the above findings. NON-VASCULAR Lower chest: Coronary calcifications. No pleural or pericardial effusion. Hepatobiliary: Subcentimeter hyperdensity in the dependent aspect of the nondilated gallbladder suggesting a partially calcified stone. No focal liver lesion or biliary ductal dilatation. Pancreas: Unremarkable. No pancreatic ductal dilatation or surrounding inflammatory changes. Spleen: Normal in size without focal abnormality. Adrenals/Urinary Tract: Adrenal glands are unremarkable. Kidneys are normal, without renal calculi, focal lesion, or hydronephrosis. Bladder is unremarkable. Stomach/Bowel: Stomach is nondistended. Small bowel decompressed. Normal appendix. Colon is nondilated, unremarkable. Lymphatic: No abdominal or pelvic adenopathy. Reproductive: Mild prostate  enlargement. Other: Left retroperitoneal hematoma measuring approximately 9.7 x 7.3 cm maximum transverse dimensions, 7.2 cm craniocaudal. Active extravasation from a small muscular branch is noted at the posterior aspect of the collection in the iliac fossa (Im129,Se4) . no ascites. No free air. Musculoskeletal: No acute or significant osseous findings. IMPRESSION: 1. 9.7 cm left retroperitoneal hematoma that active extravasation from a small muscular branch. 2. Aortic Atherosclerosis (ICD10-170.0) without aneurysm, dissection, or stenosis. 3. Cholelithiasis Electronically Signed   By: Corlis Leak M.D.   On: 08/13/2020 14:18      Assessment/Plan 1.  Left retroperitoneal hematoma.  Spontaneous.  I have independently reviewed his CT scan.  Although there is a small muscular branch that seems to be feeding the hematoma, there is not any obvious connection or easy way to find this branch.  Angiography at this time would be very limited, and I would recommend holding anticoagulation and conservative management as the vast majority of these spontaneous retroperitoneal hematomas will stop on their own.  Blood pressure and hemoglobin are currently stable.  He is in a significant amount of discomfort and these are very painful.  Pain medication as needed.  Monitor H&H. 2.  Coronary disease, status post coronary intervention October 2021.  It has been over 6 months since his interventions I would strongly recommend holding his Effient at this point. 3.  Hypertension.  Stable on outpatient medication and remained stable currently.  With bleeding, will need to monitor for hypotension.   Festus Barren, MD  08/13/2020 3:07 PM    This note was created with Dragon medical transcription system.  Any error is purely unintentional

## 2020-08-13 NOTE — ED Provider Notes (Addendum)
[facility] Emergency Department Provider Note  ____________________________________________   Event Date/Time   First MD Initiated Contact with Patient 08/13/20 1256     (approximate)  I have reviewed the triage vital signs and the nursing notes.   HISTORY  Chief Complaint Back Pain    HPI Christopher Beck is a 55 y.o. male here with severe back and left flank pain.  The patient states that he was getting out of bed today when he felt like he pulled something.  He experienced immediate, severe, left flank and left lower abdominal pain.  He states the pain is stabbing, severe, 10 out of 10, and associated with nausea and sensation of wanting to vomit.  It radiates down towards his groin but denies any actual testicular pain.  No preceding symptoms.  He was fine yesterday.  He feels nauseous and has been vomiting.  No diarrhea.  No constipation.  No history of kidney stones.  Denies any urinary symptoms.  No specific alleviating factors.    Past Medical History:  Diagnosis Date   Hypertension    Lumbar stress fracture     Patient Active Problem List   Diagnosis Date Noted   Nontraumatic retroperitoneal hematoma 08/13/2020   CAD (coronary artery disease) 08/13/2020   Trigeminal neuralgia 08/13/2020    Past Surgical History:  Procedure Laterality Date   FRACTURE SURGERY      Prior to Admission medications   Medication Sig Start Date End Date Taking? Authorizing Provider  gabapentin (NEURONTIN) 300 MG capsule Take by mouth. 07/12/18 07/12/19  [provider]  lisinopril (PRINIVIL,ZESTRIL) 10 MG tablet Take 1 tablet (10 mg total) by mouth daily. 05/30/16   Payton Mccallum, MD  albuterol (PROVENTIL HFA;VENTOLIN HFA) 108 (90 Base) MCG/ACT inhaler Inhale 1-2 puffs into the lungs every 6 (six) hours as needed for wheezing or shortness of breath. 05/30/16 08/31/18  Payton Mccallum, MD    Allergies Patient has no known allergies.  Family History   Problem Relation Age of Onset   Healthy Mother    Healthy Father     Social History Social History   Tobacco Use   Smoking status: Former    Pack years: 0.00    Types: Cigarettes    Quit date: 04/01/2018    Years since quitting: 2.3   Smokeless tobacco: Never  Vaping Use   Vaping Use: Some days  Substance Use Topics   Alcohol use: No   Drug use: Not Currently    Review of Systems  Review of Systems  Constitutional:  Negative for chills, fatigue and fever.  HENT:  Negative for sore throat.   Respiratory:  Negative for shortness of breath.   Cardiovascular:  Negative for chest pain.  Gastrointestinal:  Positive for abdominal pain, nausea and vomiting.  Genitourinary:  Positive for flank pain.  Musculoskeletal:  Negative for neck pain.  Skin:  Negative for rash and wound.  Allergic/Immunologic: Negative for immunocompromised state.  Neurological:  Negative for weakness and numbness.  Hematological:  Does not bruise/bleed easily.  All other systems reviewed and are negative.   ____________________________________________  PHYSICAL EXAM:      VITAL SIGNS: ED Triage Vitals  Enc Vitals Group     BP 08/13/20 1247 (!) 80/58     Pulse Rate 08/13/20 1247 88     Resp 08/13/20 1247 19     Temp 08/13/20 1247 98 F (36.7 C)     Temp src --      SpO2  08/13/20 1247 98 %     Weight --      Height --      Head Circumference --      Peak Flow --      Pain Score 08/13/20 1245 9     Pain Loc --      Pain Edu? --      Excl. in GC? --      Physical Exam Vitals and nursing note reviewed.  Constitutional:      General: He is not in acute distress.    Appearance: He is well-developed.     Comments: Appears very uncomfortable  HENT:     Head: Normocephalic and atraumatic.  Eyes:     Conjunctiva/sclera: Conjunctivae normal.  Cardiovascular:     Rate and Rhythm: Normal rate and regular rhythm.     Heart sounds: Normal heart sounds. No murmur heard.   No friction rub.   Pulmonary:     Effort: Pulmonary effort is normal. No respiratory distress.     Breath sounds: Normal breath sounds. No wheezing or rales.  Abdominal:     General: There is no distension.     Palpations: Abdomen is soft.     Tenderness: There is no abdominal tenderness (Diffuse, primarily in the left lower quadrant).  Genitourinary:    Comments: Tenderness in the left inguinal area.  Testes appear descended bilaterally.  Testes with normal lie and cremasteric reflexes. Musculoskeletal:     Cervical back: Neck supple.  Skin:    General: Skin is warm.     Capillary Refill: Capillary refill takes less than 2 seconds.  Neurological:     Mental Status: He is alert and oriented to person, place, and time.     Motor: No abnormal muscle tone.      ____________________________________________   LABS (all labs ordered are listed, but only abnormal results are displayed)  Labs Reviewed  CBC WITH DIFFERENTIAL/PLATELET - Abnormal; Notable for the following components:      Result Value   WBC 14.7 (*)    RDW 18.6 (*)    Neutro Abs 11.3 (*)    Monocytes Absolute 1.2 (*)    All other components within normal limits  COMPREHENSIVE METABOLIC PANEL - Abnormal; Notable for the following components:   CO2 21 (*)    Glucose, Bld 187 (*)    Creatinine, Ser 1.40 (*)    GFR, Estimated 60 (*)    All other components within normal limits  LACTIC ACID, PLASMA - Abnormal; Notable for the following components:   Lactic Acid, Venous 5.1 (*)    All other components within normal limits  LACTIC ACID, PLASMA - Abnormal; Notable for the following components:   Lactic Acid, Venous 3.5 (*)    All other components within normal limits  HEMOGLOBIN AND HEMATOCRIT, BLOOD - Abnormal; Notable for the following components:   Hemoglobin 10.5 (*)    HCT 30.5 (*)    All other components within normal limits  RESP PANEL BY RT-PCR (FLU A&B, COVID) ARPGX2  MRSA PCR SCREENING  PROTIME-INR  URINALYSIS, COMPLETE  (UACMP) WITH MICROSCOPIC  HEMOGLOBIN AND HEMATOCRIT, BLOOD  HEMOGLOBIN AND HEMATOCRIT, BLOOD  LACTIC ACID, PLASMA  LACTIC ACID, PLASMA  HIV ANTIBODY (ROUTINE TESTING W REFLEX)  SAMPLE TO BLOOD BANK  TYPE AND SCREEN    ____________________________________________  EKG:  ________________________________________  RADIOLOGY All imaging, including plain films, CT scans, and ultrasounds, independently reviewed by me, and interpretations confirmed via formal radiology reads.  ED  MD interpretation:  CT abdomen/pelvis: Large retroperitoneal hematoma with active extravasation  Official radiology report(s): CT Angio Abd/Pel W and/or Wo Contrast  Result Date: 08/13/2020 CLINICAL DATA:  Abdominal pain, low back pain EXAM: CTA ABDOMEN AND PELVIS WITH CONTRAST TECHNIQUE: Multidetector CT imaging of the abdomen and pelvis was performed using the standard protocol during bolus administration of intravenous contrast. Multiplanar reconstructed images and MIPs were obtained and reviewed to evaluate the vascular anatomy. CONTRAST:  OMNIPAQUE IOHEXOL 350 MG/ML SOLN COMPARISON:  None. FINDINGS: VASCULAR Aorta: Minimal plaque in the infrarenal segment. No aneurysm, dissection, or stenosis. Celiac: Patent without evidence of aneurysm, dissection, vasculitis or significant stenosis. SMA: Patent without evidence of aneurysm, dissection, vasculitis or significant stenosis. Renals: Single left, widely patent. Single right, with calcified ostial plaque resulting in stenosis over length of approximately 1.6 cm mild hemodynamic significance, patent distally. IMA: Patent without evidence of aneurysm, dissection, vasculitis or significant stenosis. Inflow: Eccentric partially calcified plaque in the proximal left common iliac artery resulting in approximately 50% diameter short-segment stenosis. Proximal Outflow: Bilateral common femoral and visualized portions of the superficial and profunda femoral arteries are patent  without evidence of aneurysm, dissection, vasculitis or significant stenosis. Veins: Patent hepatic veins, portal vein, SMV, splenic vein, bilateral renal veins, iliac venous system and IVC. There is tapered narrowing of the left common iliac vein posterior to the left common iliac artery. There is active extravasation into a left retroperitoneal hematoma from a small muscular branch. Review of the MIP images confirms the above findings. NON-VASCULAR Lower chest: Coronary calcifications. No pleural or pericardial effusion. Hepatobiliary: Subcentimeter hyperdensity in the dependent aspect of the nondilated gallbladder suggesting a partially calcified stone. No focal liver lesion or biliary ductal dilatation. Pancreas: Unremarkable. No pancreatic ductal dilatation or surrounding inflammatory changes. Spleen: Normal in size without focal abnormality. Adrenals/Urinary Tract: Adrenal glands are unremarkable. Kidneys are normal, without renal calculi, focal lesion, or hydronephrosis. Bladder is unremarkable. Stomach/Bowel: Stomach is nondistended. Small bowel decompressed. Normal appendix. Colon is nondilated, unremarkable. Lymphatic: No abdominal or pelvic adenopathy. Reproductive: Mild prostate enlargement. Other: Left retroperitoneal hematoma measuring approximately 9.7 x 7.3 cm maximum transverse dimensions, 7.2 cm craniocaudal. Active extravasation from a small muscular branch is noted at the posterior aspect of the collection in the iliac fossa (Im129,Se4) . no ascites. No free air. Musculoskeletal: No acute or significant osseous findings. IMPRESSION: 1. 9.7 cm left retroperitoneal hematoma that active extravasation from a small muscular branch. 2. Aortic Atherosclerosis (ICD10-170.0) without aneurysm, dissection, or stenosis. 3. Cholelithiasis Electronically Signed   By: Corlis Leak M.D.   On: 08/13/2020 14:18    ____________________________________________  PROCEDURES   Procedure(s) performed (including  Critical Care):  .Critical Care  Date/Time: 08/13/2020 3:52 PM Performed by: Shaune Pollack, MD Authorized by: Shaune Pollack, MD   Critical care provider statement:    Critical care time (minutes):  35   Critical care time was exclusive of:  Separately billable procedures and treating other patients and teaching time   Critical care was necessary to treat or prevent imminent or life-threatening deterioration of the following conditions:  Cardiac failure, circulatory failure and respiratory failure   Critical care was time spent personally by me on the following activities:  Development of treatment plan with patient or surrogate, discussions with consultants, evaluation of patient's response to treatment, examination of patient, obtaining history from patient or surrogate, ordering and performing treatments and interventions, ordering and review of laboratory studies, ordering and review of radiographic studies, pulse oximetry, re-evaluation of  patient's condition and review of old charts   I assumed direction of critical care for this patient from another provider in my specialty: no    ____________________________________________  INITIAL IMPRESSION / MDM / ASSESSMENT AND PLAN / ED COURSE  As part of my medical decision making, I reviewed the following data within the electronic MEDICAL RECORD NUMBER Nursing notes reviewed and incorporated, Old chart reviewed, Notes from prior ED visits, and [place] Controlled Substance Database       *Orene DesanctisMichael E Bartram was evaluated in Emergency Department on 08/13/2020 for the symptoms described in the history of present illness. He was evaluated in the context of the global COVID-19 pandemic, which necessitated consideration that the patient might be at risk for infection with the SARS-CoV-2 virus that causes COVID-19. Institutional protocols and algorithms that pertain to the evaluation of patients at risk for COVID-19 are in a state of rapid change based on  information released by regulatory bodies including the CDC and federal and state organizations. These policies and algorithms were followed during the patient's care in the ED.  Some ED evaluations and interventions may be delayed as a result of limited staffing during the pandemic.*     Medical Decision Making: 55 year old male here with acute, severe back pain.  Patient in obvious distress on arrival, hypotensive.  Initial concern for possible AAA or aortic dissection.  Patient started on fluids, given fentanyl, and sent for stat CT angio.  CT angio reviewed, shows large retroperitoneal hematoma with venous extravasation.  Discussed with Vascular Dr. Gilda CreaseSchnier, recommends supportive care and serial CBCs.  Patient is on Effient and had a stent placed fairly recently.  Will hold on reversing at this time.  Patient seems to be improving with fluids and pain control.  CBC shows normal hemoglobin.  Electrolytes acceptable.  Lactic acid 5, likely from his hypoperfusion related to his hypertension, will plan to repeat.  No apparent infection. Will admit to step down for close Hgb monitoring, pain control.   ____________________________________________  FINAL CLINICAL IMPRESSION(S) / ED DIAGNOSES  Final diagnoses:  Retroperitoneal hematoma  Lactic acidosis     MEDICATIONS GIVEN DURING THIS VISIT:  Medications  Chlorhexidine Gluconate Cloth 2 % PADS 6 each (has no administration in time range)  0.9 %  sodium chloride infusion (has no administration in time range)  HYDROmorphone (DILAUDID) injection 1 mg (has no administration in time range)  methocarbamol (ROBAXIN) 500 mg in dextrose 5 % 50 mL IVPB (has no administration in time range)  ondansetron (ZOFRAN) tablet 4 mg (has no administration in time range)    Or  ondansetron (ZOFRAN) injection 4 mg (has no administration in time range)  0.9 %  sodium chloride infusion ( Intravenous New Bag/Given 08/13/20 1315)  sodium chloride 0.9 % bolus 1,000 mL  (0 mLs Intravenous Stopped 08/13/20 1504)  fentaNYL (SUBLIMAZE) injection 75 mcg (75 mcg Intravenous Given 08/13/20 1314)  ondansetron (ZOFRAN) injection 4 mg (4 mg Intravenous Given 08/13/20 1313)  iohexol (OMNIPAQUE) 350 MG/ML injection 100 mL (100 mLs Intravenous Contrast Given 08/13/20 1342)  morphine 4 MG/ML injection 6 mg (6 mg Intravenous Given 08/13/20 1409)  HYDROmorphone (DILAUDID) injection 0.5 mg (0.5 mg Intravenous Given 08/13/20 1504)  sodium chloride 0.9 % bolus 1,000 mL (1,000 mLs Intravenous New Bag/Given 08/13/20 1513)  methocarbamol (ROBAXIN) tablet 750 mg (750 mg Oral Given 08/13/20 1504)  acetaminophen (TYLENOL) tablet 1,000 mg (1,000 mg Oral Given 08/13/20 1504)     ED Discharge Orders     None  Note:  This document was prepared using Dragon voice recognition software and may include unintentional dictation errors.   Shaune Pollack, MD 08/13/20 1551    Shaune Pollack, MD 08/13/20 [phone]

## 2020-08-13 NOTE — ED Triage Notes (Signed)
Pt comes via EMs from home with c/o lower back pain and pain in his groin. Pt denies any injuries. Pt states he just twisted wrong in the bed this morning.

## 2020-08-13 NOTE — ED Triage Notes (Signed)
First Nurse Note: C/O 'back pain' x 2 hours.  Pain radiating down to groin.

## 2020-08-14 DIAGNOSIS — Z0000 Encounter for general adult medical examination without abnormal findings: Secondary | ICD-10-CM

## 2020-08-14 LAB — BASIC METABOLIC PANEL
Anion gap: 7 (ref 5–15)
BUN: 19 mg/dL (ref 6–20)
CO2: 21 mmol/L — ABNORMAL LOW (ref 22–32)
Calcium: 8 mg/dL — ABNORMAL LOW (ref 8.9–10.3)
Chloride: 109 mmol/L (ref 98–111)
Creatinine, Ser: 1.28 mg/dL — ABNORMAL HIGH (ref 0.61–1.24)
GFR, Estimated: >60 mL/min (ref >60)
Glucose, Bld: 134 mg/dL — ABNORMAL HIGH (ref 70–99)
Potassium: 5.4 mmol/L — ABNORMAL HIGH (ref 3.5–5.1)
Sodium: 137 mmol/L (ref 135–145)

## 2020-08-14 LAB — HEMOGLOBIN AND HEMATOCRIT, BLOOD
HCT: 30.3 % — ABNORMAL LOW (ref 39.0–52.0)
HCT: 26.3 % — ABNORMAL LOW (ref 39.0–52.0)
HCT: 26.3 % — ABNORMAL LOW (ref 39.0–52.0)
HCT: 23.1 % — ABNORMAL LOW (ref 39.0–52.0)
Hemoglobin: 10.1 g/dL — ABNORMAL LOW (ref 13.0–17.0)
Hemoglobin: 8.6 g/dL — ABNORMAL LOW (ref 13.0–17.0)
Hemoglobin: 8.9 g/dL — ABNORMAL LOW (ref 13.0–17.0)
Hemoglobin: 7.7 g/dL — ABNORMAL LOW (ref 13.0–17.0)

## 2020-08-14 MED ORDER — SODIUM POLYSTYRENE SULFONATE 15 GM/60ML PO SUSP: 15.0000 g | Freq: Once | ORAL | Status: DC

## 2020-08-14 MED ORDER — ALUM & MAG HYDROXIDE-SIMETH 200-200-20 MG/5ML PO SUSP
30.0000 mL | Freq: Once | ORAL | Status: AC
  Administered 2020-08-14: 30 mL via ORAL
  Filled 2020-08-14: qty 30

## 2020-08-14 MED ORDER — LIDOCAINE VISCOUS HCL 2 % MT SOLN
15.0000 mL | Freq: Once | OROMUCOSAL | Status: AC
  Administered 2020-08-14: 15 mL via ORAL
  Filled 2020-08-14: qty 15

## 2020-08-14 MED ORDER — CHLORPROMAZINE HCL 25 MG PO TABS
25.0000 mg | ORAL_TABLET | Freq: Once | ORAL | Status: AC
  Administered 2020-08-14: 25 mg via ORAL
  Filled 2020-08-14 (×2): qty 1

## 2020-08-14 MED ORDER — SODIUM CHLORIDE 0.9 % IV SOLN
12.5000 mg | Freq: Four times a day (QID) | INTRAVENOUS | Status: AC | PRN
  Administered 2020-08-14: 12.5 mg via INTRAVENOUS
  Filled 2020-08-14 (×2): qty 0.5

## 2020-08-14 MED ORDER — PANTOPRAZOLE SODIUM 40 MG PO TBEC
40.0000 mg | DELAYED_RELEASE_TABLET | Freq: Once | ORAL | Status: AC
  Administered 2020-08-14: 40 mg via ORAL
  Filled 2020-08-14: qty 1

## 2020-08-14 MED ORDER — CALCIUM CARBONATE ANTACID 500 MG PO CHEW
1.0000 | CHEWABLE_TABLET | Freq: Once | ORAL | Status: AC
  Administered 2020-08-14: 200 mg via ORAL
  Filled 2020-08-14: qty 1

## 2020-08-14 MED ORDER — ACETAMINOPHEN 325 MG PO TABS
650.0000 mg | ORAL_TABLET | Freq: Four times a day (QID) | ORAL | Status: DC | PRN
  Administered 2020-08-14 – 2020-08-16 (×3): 650 mg via ORAL
  Filled 2020-08-14 (×3): qty 2

## 2020-08-14 NOTE — Progress Notes (Signed)
Patient with nausea and emesis x1. Kayexalate ordered however patient unable to ingest at this time. Notified MD of current status. Diet order changed to clear liquids.

## 2020-08-14 NOTE — Progress Notes (Signed)
[facility]  Daily Progress Note   Subjective  -   Feels much better this am. Still some pain but improved. Hgb stable. No events overnight.  Objective Vitals:   08/14/20 0200 08/14/20 0300 08/14/20 0400 08/14/20 0755  BP: (!) 111/91 111/81 104/90 120/83  Pulse: 97 99 93 94  Resp: 10 15 12 13   Temp:   98.6 F (37 C) 98.8 F (37.1 C)  TempSrc:    Oral  SpO2: 94% 96% 95% 100%  Weight:      Height:        Intake/Output Summary (Last 24 hours) at 08/14/2020 0821 Last data filed at 08/14/2020 0300 Gross per 24 hour  Intake 1000 ml  Output 700 ml  Net 300 ml    PULM  CTAB CV  RRR, slightly tachy VASC  Abdomen softer  Laboratory CBC    Component Value Date/Time   WBC 14.7 (H) 08/13/2020 1258   HGB 10.1 (L) 08/14/2020 0056   HCT 30.3 (L) 08/14/2020 0056   PLT 271 08/13/2020 1258    BMET    Component Value Date/Time   NA 137 08/14/2020 0056   K 5.4 (H) 08/14/2020 0056   CL 109 08/14/2020 0056   CO2 21 (L) 08/14/2020 0056   GLUCOSE 134 (H) 08/14/2020 0056   BUN 19 08/14/2020 0056   CREATININE 1.28 (H) 08/14/2020 0056   CALCIUM 8.0 (L) 08/14/2020 0056   GFRNONAA >60 08/14/2020 0056   GFRAA >60 05/30/2016 1517    Assessment/Planning:   Spontaneous retroperitoneal hematoma Hgb stable Pain better Ok to advance diet These will general resolve on their own without intervention. Would hold anticoagulants for 5-7 days No further vascular recs at this time   06/01/2016  08/14/2020, 8:21 AM

## 2020-08-14 NOTE — Progress Notes (Addendum)
PROGRESS NOTE    Christopher Beck  CBJ:[id number] DOB: 12-17-1965 DOA: 08/13/2020 PCP: Laurine Blazer, MD    Brief Narrative:  Christopher Beck is a 55 y.o. male with medical history significant for trigeminal neuralgia, status post recent left radiofrequency ablation for trigeminal neuralgia (05/03) coronary artery disease status post acute ST elevation MI with complications of complete heart block/V. Fib/V. tach requiring CPR/intubation and a temporary pacemaker placement, status post PCI with DES to RCA and mid LAD, history of hypertension who presents to the emergency room for evaluation of back pain mostly left-sided flank and left lower abdominal pain with radiation to his groin.  CTA ab/pel found with retroperitoneal hematoma. Vascular surgery following, rec. Conservative therapy. Close monitoring.  Consultants:  Vascular surgery  Procedures: CTA  Antimicrobials:      Subjective: Nauseous and vomited liquid today.  Hiccups.  Still with abdominal pain on the left side no shortness of breath.  Not tolerating his breakfast  Objective: Vitals:   08/14/20 0200 08/14/20 0300 08/14/20 0400 08/14/20 0755  BP: (!) 111/91 111/81 104/90 120/83  Pulse: 97 99 93 94  Resp: 10 15 12 13   Temp:   98.6 F (37 C) 98.8 F (37.1 C)  TempSrc:    Oral  SpO2: 94% 96% 95% 100%  Weight:      Height:        Intake/Output Summary (Last 24 hours) at 08/14/2020 0816 Last data filed at 08/14/2020 0300 Gross per 24 hour  Intake 1000 ml  Output 700 ml  Net 300 ml   Filed Weights   08/13/20 1555  Weight: 68 kg    Examination:  General exam: Appears calm, but tired.  Respiratory system: Clear to auscultation. Respiratory effort normal. Cardiovascular system: S1 & S2 heard, RRR. No JVD Gastrointestinal system: Abdomen is nondistended, soft aTTP at Lq. Central nervous system: Alert and oriented. No focal neurological deficits. Extremities: No edema Skin: Warm dry Psychiatry: Judgement and  insight appear normal. Mood & affect appropriate.     Data Reviewed: I have personally reviewed following labs and imaging studies  CBC: Recent Labs  Lab 08/13/20 1258 08/13/20 1509 08/13/20 1847 08/14/20 0056  WBC 14.7*  --   --   --   NEUTROABS 11.3*  --   --   --   HGB 13.3 10.5* 10.6* 10.1*  HCT 39.4 30.5* 32.4* 30.3*  MCV 85.8  --   --   --   PLT 271  --   --   --    Basic Metabolic Panel: Recent Labs  Lab 08/13/20 1258 08/14/20 0056  NA 135 137  K 4.2 5.4*  CL 101 109  CO2 21* 21*  GLUCOSE 187* 134*  BUN 15 19  CREATININE 1.40* 1.28*  CALCIUM 9.1 8.0*   GFR: Estimated Creatinine Clearance: 61.7 mL/min (A) (by C-G formula based on SCr of 1.28 mg/dL (H)). Liver Function Tests: Recent Labs  Lab 08/13/20 1258  AST 30  ALT 29  ALKPHOS 76  BILITOT 0.7  PROT 7.2  ALBUMIN 4.5   No results for input(s): LIPASE, AMYLASE in the last 168 hours. No results for input(s): AMMONIA in the last 168 hours. Coagulation Profile: Recent Labs  Lab 08/13/20 1258  INR 1.0   Cardiac Enzymes: No results for input(s): CKTOTAL, CKMB, CKMBINDEX, TROPONINI in the last 168 hours. BNP (last 3 results) No results for input(s): PROBNP in the last 8760 hours. HbA1C: No results for input(s): HGBA1C in the last 72 hours.  CBG: Recent Labs  Lab 08/13/20 1535  GLUCAP 112*   Lipid Profile: No results for input(s): CHOL, HDL, LDLCALC, TRIG, CHOLHDL, LDLDIRECT in the last 72 hours. Thyroid Function Tests: No results for input(s): TSH, T4TOTAL, FREET4, T3FREE, THYROIDAB in the last 72 hours. Anemia Panel: No results for input(s): VITAMINB12, FOLATE, FERRITIN, TIBC, IRON, RETICCTPCT in the last 72 hours. Sepsis Labs: Recent Labs  Lab 08/13/20 1258 08/13/20 1509 08/13/20 1847 08/13/20 2124  LATICACIDVEN 5.1* 3.5* 3.1* 3.3*    Recent Results (from the past 240 hour(s))  Resp Panel by RT-PCR (Flu A&B, Covid) Nasopharyngeal Swab     Status: None   Collection Time: 08/13/20   2:02 PM   Specimen: Nasopharyngeal Swab; Nasopharyngeal(NP) swabs in vial transport medium  Result Value Ref Range Status   SARS Coronavirus 2 by RT PCR NEGATIVE NEGATIVE Final    Comment: (NOTE) SARS-CoV-2 target nucleic acids are NOT DETECTED.  The SARS-CoV-2 RNA is generally detectable in upper respiratory specimens during the acute phase of infection. The lowest concentration of SARS-CoV-2 viral copies this assay can detect is 138 copies/mL. A negative result does not preclude SARS-Cov-2 infection and should not be used as the sole basis for treatment or other patient management decisions. A negative result may occur with  improper specimen collection/handling, submission of specimen other than nasopharyngeal swab, presence of viral mutation(s) within the areas targeted by this assay, and inadequate number of viral copies(<138 copies/mL). A negative result must be combined with clinical observations, patient history, and epidemiological information. The expected result is Negative.  Fact Sheet for Patients:  BloggerCourse.comhttps://www.fda.gov/media/152166/download  Fact Sheet for Healthcare Providers:  SeriousBroker.ithttps://www.fda.gov/media/152162/download  This test is no t yet approved or cleared by the [place] FDA and  has been authorized for detection and/or diagnosis of SARS-CoV-2 by FDA under an Emergency Use Authorization (EUA). This EUA will remain  in effect (meaning this test can be used) for the duration of the COVID-19 declaration under Section 564(b)(1) of the Act, 21 U.S.C.section 360bbb-3(b)(1), unless the authorization is terminated  or revoked sooner.       Influenza A by PCR NEGATIVE NEGATIVE Final   Influenza B by PCR NEGATIVE NEGATIVE Final    Comment: (NOTE) The Xpert Xpress SARS-CoV-2/FLU/RSV plus assay is intended as an aid in the diagnosis of influenza from Nasopharyngeal swab specimens and should not be used as a sole basis for treatment. Nasal washings and aspirates  are unacceptable for Xpert Xpress SARS-CoV-2/FLU/RSV testing.  Fact Sheet for Patients: BloggerCourse.comhttps://www.fda.gov/media/152166/download  Fact Sheet for Healthcare Providers: SeriousBroker.ithttps://www.fda.gov/media/152162/download  This test is not yet approved or cleared by the [place] FDA and has been authorized for detection and/or diagnosis of SARS-CoV-2 by FDA under an Emergency Use Authorization (EUA). This EUA will remain in effect (meaning this test can be used) for the duration of the COVID-19 declaration under Section 564(b)(1) of the Act, 21 U.S.C. section 360bbb-3(b)(1), unless the authorization is terminated or revoked.  Performed at [facility], [address] [place], [place] [address]   MRSA PCR Screening     Status: None   Collection Time: 08/13/20  3:40 PM   Specimen: Nasal Mucosa; Nasopharyngeal  Result Value Ref Range Status   MRSA by PCR NEGATIVE NEGATIVE Final    Comment:        The GeneXpert MRSA Assay (FDA approved for NASAL specimens only), is one component of a comprehensive MRSA colonization surveillance program. It is not intended to diagnose MRSA infection nor to guide or  monitor treatment for MRSA infections. Performed at [facility], [address] [place], [place] [address]          Radiology Studies: CT Angio Abd/Pel W and/or Wo Contrast  Result Date: 08/13/2020 CLINICAL DATA:  Abdominal pain, low back pain EXAM: CTA ABDOMEN AND PELVIS WITH CONTRAST TECHNIQUE: Multidetector CT imaging of the abdomen and pelvis was performed using the standard protocol during bolus administration of intravenous contrast. Multiplanar reconstructed images and MIPs were obtained and reviewed to evaluate the vascular anatomy. CONTRAST:  OMNIPAQUE IOHEXOL 350 MG/ML SOLN COMPARISON:  None. FINDINGS: VASCULAR Aorta: Minimal plaque in the infrarenal segment. No aneurysm, dissection, or stenosis. Celiac: Patent without evidence of aneurysm,  dissection, vasculitis or significant stenosis. SMA: Patent without evidence of aneurysm, dissection, vasculitis or significant stenosis. Renals: Single left, widely patent. Single right, with calcified ostial plaque resulting in stenosis over length of approximately 1.6 cm mild hemodynamic significance, patent distally. IMA: Patent without evidence of aneurysm, dissection, vasculitis or significant stenosis. Inflow: Eccentric partially calcified plaque in the proximal left common iliac artery resulting in approximately 50% diameter short-segment stenosis. Proximal Outflow: Bilateral common femoral and visualized portions of the superficial and profunda femoral arteries are patent without evidence of aneurysm, dissection, vasculitis or significant stenosis. Veins: Patent hepatic veins, portal vein, SMV, splenic vein, bilateral renal veins, iliac venous system and IVC. There is tapered narrowing of the left common iliac vein posterior to the left common iliac artery. There is active extravasation into a left retroperitoneal hematoma from a small muscular branch. Review of the MIP images confirms the above findings. NON-VASCULAR Lower chest: Coronary calcifications. No pleural or pericardial effusion. Hepatobiliary: Subcentimeter hyperdensity in the dependent aspect of the nondilated gallbladder suggesting a partially calcified stone. No focal liver lesion or biliary ductal dilatation. Pancreas: Unremarkable. No pancreatic ductal dilatation or surrounding inflammatory changes. Spleen: Normal in size without focal abnormality. Adrenals/Urinary Tract: Adrenal glands are unremarkable. Kidneys are normal, without renal calculi, focal lesion, or hydronephrosis. Bladder is unremarkable. Stomach/Bowel: Stomach is nondistended. Small bowel decompressed. Normal appendix. Colon is nondilated, unremarkable. Lymphatic: No abdominal or pelvic adenopathy. Reproductive: Mild prostate enlargement. Other: Left retroperitoneal hematoma  measuring approximately 9.7 x 7.3 cm maximum transverse dimensions, 7.2 cm craniocaudal. Active extravasation from a small muscular branch is noted at the posterior aspect of the collection in the iliac fossa (Im129,Se4) . no ascites. No free air. Musculoskeletal: No acute or significant osseous findings. IMPRESSION: 1. 9.7 cm left retroperitoneal hematoma that active extravasation from a small muscular branch. 2. Aortic Atherosclerosis (ICD10-170.0) without aneurysm, dissection, or stenosis. 3. Cholelithiasis Electronically Signed   By: Corlis Leak M.D.   On: 08/13/2020 14:18        Scheduled Meds:  Chlorhexidine Gluconate Cloth  6 each Topical Daily   Continuous Infusions:  sodium chloride 150 mL/hr at 08/14/20 0239   methocarbamol (ROBAXIN) IV      Assessment & Plan:   Principal Problem:   Nontraumatic retroperitoneal hematoma Active Problems:   CAD (coronary artery disease)   Trigeminal neuralgia of left side of face   Nontraumatic yes retroperitoneal hematoma He denies any trauma and was hypotensive when he arrived with systolic blood pressure in the 80s and lactic acidosis. Patient received 3 L IV fluid bolus with improvement in his blood pressure CT angiogram showed a 9.7 cm left retroperitoneal hematoma with active extravasation from a small muscular branch. Retroperitoneal hematoma appears to be secondary to Effient use, which patient has been on following his  stent angioplasty 6/14-vascular surgery following recommend conservative therapy.  Hold anticoagulation for 4 to 7 days Per vascular generally resolves on its own without intervention Monitor H&H, transfuse if hemoglobin less than 7 Pain control Since still with nausea and vomiting we will change his diet to clears       History of coronary artery disease S/p pci x3 in October 21 per pt. DAPT on hold due to above Will consult cardiology to be on board, will need to discuss reinitiation of Effient and aspirin as  his PCI is not too long ago.     History of trigeminal neuralgia Status post left radiofrequency ablation  Stable    AKI-prerenal.  Likely due to bleed and GI symptoms N/V Improving with ivf Continue to monitor   DVT prophylaxis: SCD Code Status: Full Family Communication: None at bedside Disposition Plan:  Status is: Inpatient  Remains inpatient appropriate because:Inpatient level of care appropriate due to severity of illness  Dispo: The patient is from: Home              Anticipated d/c is to: Home              Patient currently is not medically stable to d/c.   Difficult to place patient No            LOS: 1 day   Time spent: 45 min with >50% on coc    Lynn Ito, MD [facility] Pager [phone]  If 7PM-7AM, please contact night-coverage 08/14/2020, 8:16 AM

## 2020-08-15 DIAGNOSIS — D62 Acute posthemorrhagic anemia: Secondary | ICD-10-CM

## 2020-08-15 LAB — IRON AND TIBC
Iron: 69 ug/dL (ref 45–182)
Saturation Ratios: 24 % (ref 17.9–39.5)
TIBC: 283 ug/dL (ref 250–450)
UIBC: 214 ug/dL

## 2020-08-15 LAB — HEMOGLOBIN AND HEMATOCRIT, BLOOD
HCT: 21.7 % — ABNORMAL LOW (ref 39.0–52.0)
HCT: 22.3 % — ABNORMAL LOW (ref 39.0–52.0)
HCT: 23.6 % — ABNORMAL LOW (ref 39.0–52.0)
HCT: 22.4 % — ABNORMAL LOW (ref 39.0–52.0)
Hemoglobin: 7.2 g/dL — ABNORMAL LOW (ref 13.0–17.0)
Hemoglobin: 7.4 g/dL — ABNORMAL LOW (ref 13.0–17.0)
Hemoglobin: 7.9 g/dL — ABNORMAL LOW (ref 13.0–17.0)
Hemoglobin: 7.5 g/dL — ABNORMAL LOW (ref 13.0–17.0)

## 2020-08-15 LAB — BASIC METABOLIC PANEL
Anion gap: 6 (ref 5–15)
BUN: 15 mg/dL (ref 6–20)
CO2: 23 mmol/L (ref 22–32)
Calcium: 8.2 mg/dL — ABNORMAL LOW (ref 8.9–10.3)
Chloride: 107 mmol/L (ref 98–111)
Creatinine, Ser: 0.79 mg/dL (ref 0.61–1.24)
GFR, Estimated: >60 mL/min (ref >60)
Glucose, Bld: 99 mg/dL (ref 70–99)
Potassium: 3.9 mmol/L (ref 3.5–5.1)
Sodium: 136 mmol/L (ref 135–145)

## 2020-08-15 MED ORDER — METOPROLOL SUCCINATE ER 25 MG PO TB24
25.0000 mg | ORAL_TABLET | Freq: Every day | ORAL | Status: DC
  Administered 2020-08-15 – 2020-08-17 (×3): 25 mg via ORAL
  Filled 2020-08-15 (×3): qty 1

## 2020-08-15 MED ORDER — PANTOPRAZOLE SODIUM 40 MG PO TBEC
40.0000 mg | DELAYED_RELEASE_TABLET | Freq: Every day | ORAL | Status: DC
  Administered 2020-08-15 – 2020-08-17 (×3): 40 mg via ORAL
  Filled 2020-08-15 (×3): qty 1

## 2020-08-15 MED ORDER — AMLODIPINE BESYLATE 10 MG PO TABS: 10.0000 mg | ORAL_TABLET | Freq: Every day | ORAL | Status: DC

## 2020-08-15 MED ORDER — GABAPENTIN 300 MG PO CAPS
600.0000 mg | ORAL_CAPSULE | Freq: Three times a day (TID) | ORAL | Status: DC
  Administered 2020-08-15 – 2020-08-17 (×7): 600 mg via ORAL
  Filled 2020-08-15 (×7): qty 2

## 2020-08-15 MED ORDER — ROSUVASTATIN CALCIUM 10 MG PO TABS
40.0000 mg | ORAL_TABLET | Freq: Every day | ORAL | Status: DC
  Administered 2020-08-15 – 2020-08-17 (×3): 40 mg via ORAL
  Filled 2020-08-15 (×3): qty 4

## 2020-08-15 MED ORDER — OXCARBAZEPINE 150 MG PO TABS
300.0000 mg | ORAL_TABLET | Freq: Three times a day (TID) | ORAL | Status: DC
  Administered 2020-08-15 – 2020-08-17 (×7): 300 mg via ORAL
  Filled 2020-08-15 (×11): qty 2

## 2020-08-15 NOTE — Consult Note (Signed)
Cardiology Consultation Note    Patient ID: Christopher Beck, MRN: [id number], DOB/AGE: 09/14/1965 55 y.o. Admit date: 08/13/2020   Date of Consult: 08/15/2020 Primary Physician: Laurine Blazer, MD Primary Cardiologist: Dr. Sharyn Lull, [facility]  Chief Complaint: back pain Reason for Consultation: retroperitoneal bleed Requesting MD: Dr. Marylu Lund  HPI: Christopher Beck is a 55 y.o. male with history of coronary artery disease s/p inferior RCA STEMI treated with PCI as well as a staged LAD PCI in October 2021 with relook cardiac catheterization s several days later showing patent stents.  These procedures were done via radial approach.  Recommendations were to stay on dual antiplatelet therapy for up to 1 year.  His ejection fraction was 53%.  He had a functional study in April 2022 showing good exercise tolerance EF of 65% with no ischemia.  He presented to the emergency room on August 16, 2020 with complaints of  back and left-sided flank pain left lower abdominal plain with radiation to his groin.  CT angiogram of the abdomen pelvis showed a 9.7 cm left retroperitoneal hematoma with active extravasation from a small muscular branch.  Hemoglobin was 10.6 on presentation currently 7.4.  Creatinine was 1.4 on admission currently 0.79.  EKG showed sinus rhythm with no active ischemia.  He was seen by vascular surgery who felt recommendations of holding antiplatelet therapy or anticoagulation and monitoring.  He was appropriately taken off of aspirin and Effient.  Past Medical History:  Diagnosis Date   Hypertension    Lumbar stress fracture       Surgical History:  Past Surgical History:  Procedure Laterality Date   FRACTURE SURGERY       Home Meds: Prior to Admission medications   Medication Sig Start Date End Date Taking? Authorizing Provider  amLODipine (NORVASC) 10 MG tablet Take 1 tablet by mouth daily. 05/22/20  Yes [provider]  aspirin 81 MG chewable tablet Chew 81 mg by mouth  daily. 05/09/20  Yes [provider]  esomeprazole (NEXIUM) 40 MG capsule Take 40 mg by mouth daily at 12 noon.   Yes [provider]  gabapentin (NEURONTIN) 600 MG tablet Take 600 mg by mouth 3 (three) times daily. 07/31/20  Yes [provider]  hydrochlorothiazide (HYDRODIURIL) 12.5 MG tablet Take 12.5 mg by mouth daily. 02/29/20  Yes [provider]  JARDIANCE 10 MG TABS tablet Take 10 mg by mouth daily. 08/02/20  Yes [provider]  lisinopril (PRINIVIL,ZESTRIL) 10 MG tablet Take 1 tablet (10 mg total) by mouth daily. 05/30/16  Yes Payton Mccallum, MD  metoprolol succinate (TOPROL-XL) 25 MG 24 hr tablet Take 1 tablet by mouth daily. 07/13/20  Yes [provider]  OXcarbazepine (TRILEPTAL) 150 MG tablet Take 3-4 tablets by mouth 3 (three) times daily. 06/07/20  Yes [provider]  prasugrel (EFFIENT) 10 MG TABS tablet Take 10 mg by mouth daily. 06/06/20  Yes [provider]  rosuvastatin (CRESTOR) 40 MG tablet Take 40 mg by mouth daily. 06/06/20  Yes [provider]  gabapentin (NEURONTIN) 300 MG capsule Take by mouth. 07/12/18 07/12/19  [provider]  albuterol (PROVENTIL HFA;VENTOLIN HFA) 108 (90 Base) MCG/ACT inhaler Inhale 1-2 puffs into the lungs every 6 (six) hours as needed for wheezing or shortness of breath. 05/30/16 08/31/18  Payton Mccallum, MD    Inpatient Medications:   Chlorhexidine Gluconate Cloth  6 each Topical Daily   gabapentin  600 mg Oral TID   metoprolol succinate  25  mg Oral Daily   OXcarbazepine  300 mg Oral TID   pantoprazole  40 mg Oral Daily   rosuvastatin  40 mg Oral Daily    sodium chloride Stopped (08/14/20 2308)   methocarbamol (ROBAXIN) IV     promethazine (PHENERGAN) injection (IM or IVPB) Stopped (08/14/20 2323)    Allergies: No Known Allergies  Social History   Socioeconomic History   Marital status: Widowed    Spouse name: Not on file   Number of children: Not on file    Years of education: Not on file   Highest education level: Not on file  Occupational History   Not on file  Tobacco Use   Smoking status: Former    Pack years: 0.00    Types: Cigarettes    Quit date: 04/01/2018    Years since quitting: 2.3   Smokeless tobacco: Never  Vaping Use   Vaping Use: Some days  Substance and Sexual Activity   Alcohol use: No   Drug use: Not Currently   Sexual activity: Not on file  Other Topics Concern   Not on file  Social History Narrative   Not on file   Social Determinants of Health   Financial Resource Strain: Not on file  Food Insecurity: Not on file  Transportation Needs: Not on file  Physical Activity: Not on file  Stress: Not on file  Social Connections: Not on file  Intimate Partner Violence: Not on file     Family History  Problem Relation Age of Onset   Healthy Mother    Healthy Father      Review of Systems: A 12-system review of systems was performed and is negative except as noted in the HPI.  Labs: No results for input(s): CKTOTAL, CKMB, TROPONINI in the last 72 hours. Lab Results  Component Value Date   WBC 14.7 (H) 08/13/2020   HGB 7.4 (L) 08/15/2020   HCT 22.3 (L) 08/15/2020   MCV 85.8 08/13/2020   PLT 271 08/13/2020    Recent Labs  Lab 08/13/20 1258 08/14/20 0056 08/15/20 0138  NA 135   < > 136  K 4.2   < > 3.9  CL 101   < > 107  CO2 21*   < > 23  BUN 15   < > 15  CREATININE 1.40*   < > 0.79  CALCIUM 9.1   < > 8.2*  PROT 7.2  --   --   BILITOT 0.7  --   --   ALKPHOS 76  --   --   ALT 29  --   --   AST 30  --   --   GLUCOSE 187*   < > 99   < > = values in this interval not displayed.   No results found for: CHOL, HDL, LDLCALC, TRIG No results found for: DDIMER  Radiology/Studies:  CT Angio Abd/Pel W and/or Wo Contrast  Result Date: 08/13/2020 CLINICAL DATA:  Abdominal pain, low back pain EXAM: CTA ABDOMEN AND PELVIS WITH CONTRAST TECHNIQUE: Multidetector CT imaging of the abdomen and pelvis was  performed using the standard protocol during bolus administration of intravenous contrast. Multiplanar reconstructed images and MIPs were obtained and reviewed to evaluate the vascular anatomy. CONTRAST:  OMNIPAQUE IOHEXOL 350 MG/ML SOLN COMPARISON:  None. FINDINGS: VASCULAR Aorta: Minimal plaque in the infrarenal segment. No aneurysm, dissection, or stenosis. Celiac: Patent without evidence of aneurysm, dissection, vasculitis or significant stenosis. SMA: Patent without evidence of aneurysm, dissection, vasculitis  or significant stenosis. Renals: Single left, widely patent. Single right, with calcified ostial plaque resulting in stenosis over length of approximately 1.6 cm mild hemodynamic significance, patent distally. IMA: Patent without evidence of aneurysm, dissection, vasculitis or significant stenosis. Inflow: Eccentric partially calcified plaque in the proximal left common iliac artery resulting in approximately 50% diameter short-segment stenosis. Proximal Outflow: Bilateral common femoral and visualized portions of the superficial and profunda femoral arteries are patent without evidence of aneurysm, dissection, vasculitis or significant stenosis. Veins: Patent hepatic veins, portal vein, SMV, splenic vein, bilateral renal veins, iliac venous system and IVC. There is tapered narrowing of the left common iliac vein posterior to the left common iliac artery. There is active extravasation into a left retroperitoneal hematoma from a small muscular branch. Review of the MIP images confirms the above findings. NON-VASCULAR Lower chest: Coronary calcifications. No pleural or pericardial effusion. Hepatobiliary: Subcentimeter hyperdensity in the dependent aspect of the nondilated gallbladder suggesting a partially calcified stone. No focal liver lesion or biliary ductal dilatation. Pancreas: Unremarkable. No pancreatic ductal dilatation or surrounding inflammatory changes. Spleen: Normal in size without  focal abnormality. Adrenals/Urinary Tract: Adrenal glands are unremarkable. Kidneys are normal, without renal calculi, focal lesion, or hydronephrosis. Bladder is unremarkable. Stomach/Bowel: Stomach is nondistended. Small bowel decompressed. Normal appendix. Colon is nondilated, unremarkable. Lymphatic: No abdominal or pelvic adenopathy. Reproductive: Mild prostate enlargement. Other: Left retroperitoneal hematoma measuring approximately 9.7 x 7.3 cm maximum transverse dimensions, 7.2 cm craniocaudal. Active extravasation from a small muscular branch is noted at the posterior aspect of the collection in the iliac fossa (Im129,Se4) . no ascites. No free air. Musculoskeletal: No acute or significant osseous findings. IMPRESSION: 1. 9.7 cm left retroperitoneal hematoma that active extravasation from a small muscular branch. 2. Aortic Atherosclerosis (ICD10-170.0) without aneurysm, dissection, or stenosis. 3. Cholelithiasis Electronically Signed   By: Corlis Leak M.D.   On: 08/13/2020 14:18    Wt Readings from Last 3 Encounters:  08/13/20 68 kg  08/31/18 68 kg  05/30/16 56.7 kg    EKG: nsr  Physical Exam:  Blood pressure 116/80, pulse 72, temperature 98.4 F (36.9 C), temperature source Oral, resp. rate 11, height 5\' 7"  (1.702 m), weight 68 kg, SpO2 95 %. Body mass index is 23.48 kg/m. General: Well developed, well nourished, in no acute distress. Head: Normocephalic, atraumatic, sclera non-icteric, no xanthomas, nares are without discharge.  Neck: Negative for carotid bruits. JVD not elevated. Lungs: Clear bilaterally to auscultation without wheezes, rales, or rhonchi. Breathing is unlabored. Heart: RRR with S1 S2. No murmurs, rubs, or gallops appreciated. Abdomen: Soft, non-tender, non-distended with normoactive bowel sounds. No hepatomegaly. No rebound/guarding. No obvious abdominal masses. Msk:  Strength and tone appear normal for age. Extremities: No clubbing or cyanosis. No edema.  Distal  pedal pulses are 2+ and equal bilaterally. Neuro: Alert and oriented X 3. No facial asymmetry. No focal deficit. Moves all extremities spontaneously. Psych:  Responds to questions appropriately with a normal affect.     Assessment and Plan   HPI: COPE MARTE is a 55 y.o. male with history of coronary artery disease s/p inferior RCA STEMI treated with PCI as well as a staged LAD PCI in October 2021 with relook cardiac catheterization s several days later showing patent stents.  These procedures were done via radial approach.  Recommendations were to stay on dual antiplatelet therapy for up to 1 year.  His ejection fraction was 53%.  He had a functional study in April 2022 showing good  exercise tolerance EF of 65% with no ischemia.  He presented to the emergency room on August 16, 2020 with complaints of  back and left-sided flank pain left lower abdominal plain with radiation to his groin.  CT angiogram of the abdomen pelvis showed a 9.7 cm left retroperitoneal hematoma with active extravasation from a small muscular branch.  Hemoglobin was 10.6 on presentation currently 7.4.  Creatinine was 1.4 on admission currently 0.79.  EKG showed sinus rhythm with no active ischemia.  He was seen by vascular surgery who felt recommendations of holding antiplatelet therapy or anticoagulation and monitoring.  He was appropriately taken off of aspirin and Effient. Would remain off of effient and asa until stable. Would then go back on asa 81 mg alone.   Signed, Dalia HeadingKenneth A Vaniya Augspurger MD 08/15/2020, 8:23 AM Pager: [phone]

## 2020-08-15 NOTE — Progress Notes (Signed)
PROGRESS NOTE    Christopher DesanctisMichael E Solinger  ZOX:[id number] DOB: 05/09/1965 DOA: 08/13/2020 PCP: Laurine Blazerobinson, Anne, MD   Chief complaint.  Left lower quadrant abdominal pain. Brief Narrative:  Christopher Beck is a 55 y.o. male with medical history significant for trigeminal neuralgia, status post recent left radiofrequency ablation for trigeminal neuralgia (05/03) coronary artery disease status post acute ST elevation MI with complications of complete heart block/V. Fib/V. tach requiring CPR/intubation and a temporary pacemaker placement, status post PCI with DES to RCA and mid LAD, history of hypertension who presents to the emergency room for evaluation of back pain mostly left-sided flank and left lower abdominal pain with radiation to his groin.   CTA ab/pel found with retroperitoneal hematoma. Vascular surgery following, rec. Conservative therapy. Close monitoring.   Assessment & Plan:   Principal Problem:   Nontraumatic retroperitoneal hematoma Active Problems:   CAD (coronary artery disease)   Trigeminal neuralgia of left side of face  #1.  Nontraumatic retroperitoneal hematoma. Acute blood loss anemia. Transient hypotension. Patient is CT scan showed retroperitoneal bleeding.  Hemoglobin dropped down to 7.4.  Iron level was normal.  Patient has been seen by vascular surgery, not considering any intervention at this time.  Discussed with cardiology, okay to hold off antiplatelets treatment. Patient blood pressure has been stabilized.  No longer has any hypotension.  We will continue to follow hemoglobin and transfuse as needed.  2.  Coronary artery disease s/p intervention. Discussed with cardiology, patient cardiac stents was placed more than 6 months ago, okay to hold dual antiplatelet treatment.  #3.  Trigeminal neuralgia. Restart home medicines.  4.  AKI. Renal function improved.  Discontinue IV fluids  DVT prophylaxis: SCDs Code Status: Full Family Communication:  Disposition  Plan:    Status is: Inpatient  Remains inpatient appropriate because:Inpatient level of care appropriate due to severity of illness  Dispo: The patient is from: Home              Anticipated d/c is to: Home              Patient currently is not medically stable to d/c.   Difficult to place patient No        I/O last 3 completed shifts: In: 5278.5 [P.O.:720; I.V.:4508.5; IV Piggyback:50] Out: 2475 [Urine:2475] No intake/output data recorded.     Consultants:  Vascular surgery, cardiology  Procedures: None  Antimicrobials: None  Subjective: Patient feels well today, he was nauseated yesterday, nausea has been better today.  Diet advanced to soft. Denies any short of breath or cough. Complain some left lower quadrant abdominal pain. No fever or chills. No dysuria hematuria.  Objective: Vitals:   08/15/20 0100 08/15/20 0200 08/15/20 0300 08/15/20 0800  BP: 133/82 113/77 116/80 139/79  Pulse: 83 81 72 86  Resp: 20 12 11 16   Temp:  98.4 F (36.9 C)  98.3 F (36.8 C)  TempSrc:  Oral  Oral  SpO2: 100% 92% 95% 100%  Weight:      Height:        Intake/Output Summary (Last 24 hours) at 08/15/2020 1243 Last data filed at 08/15/2020 0515 Gross per 24 hour  Intake 5158.53 ml  Output 1625 ml  Net 3533.53 ml   Filed Weights   08/13/20 1555  Weight: 68 kg    Examination:  General exam: Appears calm and comfortable  Respiratory system: Clear to auscultation. Respiratory effort normal. Cardiovascular system: S1 & S2 heard, RRR. No JVD, murmurs, rubs, gallops or  clicks. No pedal edema. Gastrointestinal system: Abdomen is nondistended, soft and LLQ tender. No organomegaly or masses felt. Normal bowel sounds heard. Central nervous system: Alert and oriented. No focal neurological deficits. Extremities: Symmetric 5 x 5 power. Skin: No rashes, lesions or ulcers Psychiatry: Judgement and insight appear normal. Mood & affect appropriate.     Data Reviewed: I have  personally reviewed following labs and imaging studies  CBC: Recent Labs  Lab 08/13/20 1258 08/13/20 1509 08/14/20 1001 08/14/20 1335 08/14/20 1926 08/15/20 0138 08/15/20 0802  WBC 14.7*  --   --   --   --   --   --   NEUTROABS 11.3*  --   --   --   --   --   --   HGB 13.3   < > 8.6* 8.9* 7.7* 7.2* 7.4*  HCT 39.4   < > 26.3* 26.3* 23.1* 21.7* 22.3*  MCV 85.8  --   --   --   --   --   --   PLT 271  --   --   --   --   --   --    < > = values in this interval not displayed.   Basic Metabolic Panel: Recent Labs  Lab 08/13/20 1258 08/14/20 0056 08/15/20 0138  NA 135 137 136  K 4.2 5.4* 3.9  CL 101 109 107  CO2 21* 21* 23  GLUCOSE 187* 134* 99  BUN 15 19 15   CREATININE 1.40* 1.28* 0.79  CALCIUM 9.1 8.0* 8.2*   GFR: Estimated Creatinine Clearance: 98.7 mL/min (by C-G formula based on SCr of 0.79 mg/dL). Liver Function Tests: Recent Labs  Lab 08/13/20 1258  AST 30  ALT 29  ALKPHOS 76  BILITOT 0.7  PROT 7.2  ALBUMIN 4.5   No results for input(s): LIPASE, AMYLASE in the last 168 hours. No results for input(s): AMMONIA in the last 168 hours. Coagulation Profile: Recent Labs  Lab 08/13/20 1258  INR 1.0   Cardiac Enzymes: No results for input(s): CKTOTAL, CKMB, CKMBINDEX, TROPONINI in the last 168 hours. BNP (last 3 results) No results for input(s): PROBNP in the last 8760 hours. HbA1C: No results for input(s): HGBA1C in the last 72 hours. CBG: Recent Labs  Lab 08/13/20 1535  GLUCAP 112*   Lipid Profile: No results for input(s): CHOL, HDL, LDLCALC, TRIG, CHOLHDL, LDLDIRECT in the last 72 hours. Thyroid Function Tests: No results for input(s): TSH, T4TOTAL, FREET4, T3FREE, THYROIDAB in the last 72 hours. Anemia Panel: Recent Labs    08/15/20 0142  TIBC 283  IRON 69   Sepsis Labs: Recent Labs  Lab 08/13/20 1258 08/13/20 1509 08/13/20 1847 08/13/20 2124  LATICACIDVEN 5.1* 3.5* 3.1* 3.3*    Recent Results (from the past 240 hour(s))  Resp  Panel by RT-PCR (Flu A&B, Covid) Nasopharyngeal Swab     Status: None   Collection Time: 08/13/20  2:02 PM   Specimen: Nasopharyngeal Swab; Nasopharyngeal(NP) swabs in vial transport medium  Result Value Ref Range Status   SARS Coronavirus 2 by RT PCR NEGATIVE NEGATIVE Final    Comment: (NOTE) SARS-CoV-2 target nucleic acids are NOT DETECTED.  The SARS-CoV-2 RNA is generally detectable in upper respiratory specimens during the acute phase of infection. The lowest concentration of SARS-CoV-2 viral copies this assay can detect is 138 copies/mL. A negative result does not preclude SARS-Cov-2 infection and should not be used as the sole basis for treatment or other patient management decisions. A negative result  may occur with  improper specimen collection/handling, submission of specimen other than nasopharyngeal swab, presence of viral mutation(s) within the areas targeted by this assay, and inadequate number of viral copies(<138 copies/mL). A negative result must be combined with clinical observations, patient history, and epidemiological information. The expected result is Negative.  Fact Sheet for Patients:  BloggerCourse.com  Fact Sheet for Healthcare Providers:  SeriousBroker.it  This test is no t yet approved or cleared by the [place] FDA and  has been authorized for detection and/or diagnosis of SARS-CoV-2 by FDA under an Emergency Use Authorization (EUA). This EUA will remain  in effect (meaning this test can be used) for the duration of the COVID-19 declaration under Section 564(b)(1) of the Act, 21 U.S.C.section 360bbb-3(b)(1), unless the authorization is terminated  or revoked sooner.       Influenza A by PCR NEGATIVE NEGATIVE Final   Influenza B by PCR NEGATIVE NEGATIVE Final    Comment: (NOTE) The Xpert Xpress SARS-CoV-2/FLU/RSV plus assay is intended as an aid in the diagnosis of influenza from Nasopharyngeal  swab specimens and should not be used as a sole basis for treatment. Nasal washings and aspirates are unacceptable for Xpert Xpress SARS-CoV-2/FLU/RSV testing.  Fact Sheet for Patients: BloggerCourse.com  Fact Sheet for Healthcare Providers: SeriousBroker.it  This test is not yet approved or cleared by the [place] FDA and has been authorized for detection and/or diagnosis of SARS-CoV-2 by FDA under an Emergency Use Authorization (EUA). This EUA will remain in effect (meaning this test can be used) for the duration of the COVID-19 declaration under Section 564(b)(1) of the Act, 21 U.S.C. section 360bbb-3(b)(1), unless the authorization is terminated or revoked.  Performed at [facility], [address] [place], [place] [address]   MRSA PCR Screening     Status: None   Collection Time: 08/13/20  3:40 PM   Specimen: Nasal Mucosa; Nasopharyngeal  Result Value Ref Range Status   MRSA by PCR NEGATIVE NEGATIVE Final    Comment:        The GeneXpert MRSA Assay (FDA approved for NASAL specimens only), is one component of a comprehensive MRSA colonization surveillance program. It is not intended to diagnose MRSA infection nor to guide or monitor treatment for MRSA infections. Performed at [facility], [address] [place], [place] [address]          Radiology Studies: CT Angio Abd/Pel W and/or Wo Contrast  Result Date: 08/13/2020 CLINICAL DATA:  Abdominal pain, low back pain EXAM: CTA ABDOMEN AND PELVIS WITH CONTRAST TECHNIQUE: Multidetector CT imaging of the abdomen and pelvis was performed using the standard protocol during bolus administration of intravenous contrast. Multiplanar reconstructed images and MIPs were obtained and reviewed to evaluate the vascular anatomy. CONTRAST:  OMNIPAQUE IOHEXOL 350 MG/ML SOLN COMPARISON:  None. FINDINGS: VASCULAR Aorta: Minimal plaque in the infrarenal  segment. No aneurysm, dissection, or stenosis. Celiac: Patent without evidence of aneurysm, dissection, vasculitis or significant stenosis. SMA: Patent without evidence of aneurysm, dissection, vasculitis or significant stenosis. Renals: Single left, widely patent. Single right, with calcified ostial plaque resulting in stenosis over length of approximately 1.6 cm mild hemodynamic significance, patent distally. IMA: Patent without evidence of aneurysm, dissection, vasculitis or significant stenosis. Inflow: Eccentric partially calcified plaque in the proximal left common iliac artery resulting in approximately 50% diameter short-segment stenosis. Proximal Outflow: Bilateral common femoral and visualized portions of the superficial and profunda femoral arteries are patent without evidence of aneurysm, dissection, vasculitis or  significant stenosis. Veins: Patent hepatic veins, portal vein, SMV, splenic vein, bilateral renal veins, iliac venous system and IVC. There is tapered narrowing of the left common iliac vein posterior to the left common iliac artery. There is active extravasation into a left retroperitoneal hematoma from a small muscular branch. Review of the MIP images confirms the above findings. NON-VASCULAR Lower chest: Coronary calcifications. No pleural or pericardial effusion. Hepatobiliary: Subcentimeter hyperdensity in the dependent aspect of the nondilated gallbladder suggesting a partially calcified stone. No focal liver lesion or biliary ductal dilatation. Pancreas: Unremarkable. No pancreatic ductal dilatation or surrounding inflammatory changes. Spleen: Normal in size without focal abnormality. Adrenals/Urinary Tract: Adrenal glands are unremarkable. Kidneys are normal, without renal calculi, focal lesion, or hydronephrosis. Bladder is unremarkable. Stomach/Bowel: Stomach is nondistended. Small bowel decompressed. Normal appendix. Colon is nondilated, unremarkable. Lymphatic: No abdominal or  pelvic adenopathy. Reproductive: Mild prostate enlargement. Other: Left retroperitoneal hematoma measuring approximately 9.7 x 7.3 cm maximum transverse dimensions, 7.2 cm craniocaudal. Active extravasation from a small muscular branch is noted at the posterior aspect of the collection in the iliac fossa (Im129,Se4) . no ascites. No free air. Musculoskeletal: No acute or significant osseous findings. IMPRESSION: 1. 9.7 cm left retroperitoneal hematoma that active extravasation from a small muscular branch. 2. Aortic Atherosclerosis (ICD10-170.0) without aneurysm, dissection, or stenosis. 3. Cholelithiasis Electronically Signed   By: Corlis Leak M.D.   On: 08/13/2020 14:18        Scheduled Meds:  Chlorhexidine Gluconate Cloth  6 each Topical Daily   gabapentin  600 mg Oral TID   metoprolol succinate  25 mg Oral Daily   OXcarbazepine  300 mg Oral TID   pantoprazole  40 mg Oral Daily   rosuvastatin  40 mg Oral Daily   Continuous Infusions:  sodium chloride Stopped (08/14/20 2308)   methocarbamol (ROBAXIN) IV     promethazine (PHENERGAN) injection (IM or IVPB) Stopped (08/14/20 2323)     LOS: 2 days    Time spent: 28 minutes    Marrion Coy, MD [facility]   To contact the attending provider between 7A-7P or the covering provider during after hours 7P-7A, please log into the web site [email or web address] and access using universal [place] password for that web site. If you do not have the password, please call the hospital operator.  08/15/2020, 12:43 PM

## 2020-08-16 ENCOUNTER — Encounter: Payer: Self-pay | Admitting: Internal Medicine

## 2020-08-16 DIAGNOSIS — I2510 Atherosclerotic heart disease of native coronary artery without angina pectoris: Secondary | ICD-10-CM

## 2020-08-16 LAB — HEMOGLOBIN AND HEMATOCRIT, BLOOD
HCT: 20.6 % — ABNORMAL LOW (ref 39.0–52.0)
HCT: 25.1 % — ABNORMAL LOW (ref 39.0–52.0)
HCT: 25.2 % — ABNORMAL LOW (ref 39.0–52.0)
Hemoglobin: 6.9 g/dL — ABNORMAL LOW (ref 13.0–17.0)
Hemoglobin: 8.5 g/dL — ABNORMAL LOW (ref 13.0–17.0)
Hemoglobin: 8.6 g/dL — ABNORMAL LOW (ref 13.0–17.0)

## 2020-08-16 LAB — TYPE AND SCREEN
ABO/RH(D): A POS
Antibody Screen: NEGATIVE

## 2020-08-16 LAB — PREPARE RBC (CROSSMATCH)

## 2020-08-16 LAB — ABO/RH: ABO/RH(D): A POS

## 2020-08-16 LAB — VITAMIN B12: Vitamin B-12: 299 pg/mL (ref 180–914)

## 2020-08-16 MED ORDER — CYANOCOBALAMIN 1000 MCG/ML IJ SOLN
1000.0000 ug | Freq: Once | INTRAMUSCULAR | Status: AC
  Administered 2020-08-16: 1000 ug via INTRAMUSCULAR
  Filled 2020-08-16: qty 1

## 2020-08-16 MED ORDER — SODIUM CHLORIDE 0.9% IV SOLUTION: Freq: Once | INTRAVENOUS | Status: AC

## 2020-08-16 MED ORDER — LACTULOSE 10 GM/15ML PO SOLN
20.0000 g | Freq: Once | ORAL | Status: AC
  Administered 2020-08-16: 20 g via ORAL
  Filled 2020-08-16: qty 30

## 2020-08-16 MED ORDER — POLYVINYL ALCOHOL 1.4 % OP SOLN
1.0000 [drp] | OPHTHALMIC | Status: DC | PRN
  Administered 2020-08-17 (×2): 1 [drp] via OPHTHALMIC
  Filled 2020-08-16: qty 15

## 2020-08-16 NOTE — Progress Notes (Signed)
PROGRESS NOTE    Christopher Beck  DVV:[id number] DOB: 1966-02-09 DOA: 08/13/2020 PCP: Laurine Blazer, MD   Chief complaint.  Left lower quadrant pain. Brief Narrative:  Christopher Beck is a 55 y.o. male with medical history significant for trigeminal neuralgia, status post recent left radiofrequency ablation for trigeminal neuralgia (05/03) coronary artery disease status post acute ST elevation MI with complications of complete heart block/V. Fib/V. tach requiring CPR/intubation and a temporary pacemaker placement, status post PCI with DES to RCA and mid LAD, history of hypertension who presents to the emergency room for evaluation of back pain mostly left-sided flank and left lower abdominal pain with radiation to his groin.   CTA ab/pel found with retroperitoneal hematoma. Vascular surgery following, rec. Conservative therapy.    Assessment & Plan:   Principal Problem:   Nontraumatic retroperitoneal hematoma Active Problems:   CAD (coronary artery disease)   Trigeminal neuralgia of left side of face   Acute blood loss anemia  #1.  Nontraumatic retroperitoneal hematoma. Acute blood loss anemia. Transient hypotension. Another unit of PRBC yesterday evening.  Hemoglobin seem to be better.  Iron level normal, B12 level borderline, check homocystine level, starting B12 injection.  2.  Coronary artery disease status post PCI and stents. Will hold off anticoagulation and antiplatelets due to acute bleed.  3.  Acute kidney injury Renal function improved  4.  Trigeminal neuralgia.    DVT prophylaxis: SCDs Code Status: full Family Communication:  Disposition Plan:    Status is: Inpatient  Remains inpatient appropriate because:Inpatient level of care appropriate due to severity of illness  Dispo: The patient is from: Home              Anticipated d/c is to: Home              Patient currently is not medically stable to d/c.   Difficult to place patient No        I/O  last 3 completed shifts: In: 1453.4 [P.O.:480; I.V.:613; Blood:310.4; IV Piggyback:50] Out: 1925 [Urine:1925] Total I/O In: -  Out: 350 [Urine:350]     Consultants:  vascular  Procedures: none  Antimicrobials: None Subjective: Patient has some ecchymosis at the left flank, left lower quadrant abdominal pain seem to be improving. No short of breath or cough. No abdominal pain or nausea vomiting. No fever chills No dysuria or hematuria.  Objective: Vitals:   08/16/20 0319 08/16/20 0545 08/16/20 0800 08/16/20 1130  BP: 113/77 97/66 106/77 114/75  Pulse: 76 79 65 79  Resp: 18 19 16 16   Temp: 98.5 F (36.9 C) 98.1 F (36.7 C) 98.5 F (36.9 C) 98.9 F (37.2 C)  TempSrc: Oral Oral Oral Oral  SpO2:  95% 97% 98%  Weight:      Height:        Intake/Output Summary (Last 24 hours) at 08/16/2020 1221 Last data filed at 08/16/2020 1000 Gross per 24 hour  Intake 790.42 ml  Output 1450 ml  Net -659.58 ml   Filed Weights   08/13/20 1555  Weight: 68 kg    Examination:  General exam: Appears calm and comfortable  Respiratory system: Clear to auscultation. Respiratory effort normal. Cardiovascular system: S1 & S2 heard, RRR. No JVD, murmurs, rubs, gallops or clicks. No pedal edema. Gastrointestinal system: Abdomen is nondistended, soft and nontender. No organomegaly or masses felt. Normal bowel sounds heard. Central nervous system: Alert and oriented. No focal neurological deficits. Extremities: Symmetric 5 x 5 power. Skin: No rashes, lesions  or ulcers Psychiatry: Judgement and insight appear normal. Mood & affect appropriate.     Data Reviewed: I have personally reviewed following labs and imaging studies  CBC: Recent Labs  Lab 08/13/20 1258 08/13/20 1509 08/15/20 0802 08/15/20 1324 08/15/20 1928 08/16/20 0131 08/16/20 0853  WBC 14.7*  --   --   --   --   --   --   NEUTROABS 11.3*  --   --   --   --   --   --   HGB 13.3   < > 7.4* 7.9* 7.5* 6.9* 8.5*  HCT  39.4   < > 22.3* 23.6* 22.4* 20.6* 25.1*  MCV 85.8  --   --   --   --   --   --   PLT 271  --   --   --   --   --   --    < > = values in this interval not displayed.   Basic Metabolic Panel: Recent Labs  Lab 08/13/20 1258 08/14/20 0056 08/15/20 0138  NA 135 137 136  K 4.2 5.4* 3.9  CL 101 109 107  CO2 21* 21* 23  GLUCOSE 187* 134* 99  BUN 15 19 15   CREATININE 1.40* 1.28* 0.79  CALCIUM 9.1 8.0* 8.2*   GFR: Estimated Creatinine Clearance: 98.7 mL/min (by C-G formula based on SCr of 0.79 mg/dL). Liver Function Tests: Recent Labs  Lab 08/13/20 1258  AST 30  ALT 29  ALKPHOS 76  BILITOT 0.7  PROT 7.2  ALBUMIN 4.5   No results for input(s): LIPASE, AMYLASE in the last 168 hours. No results for input(s): AMMONIA in the last 168 hours. Coagulation Profile: Recent Labs  Lab 08/13/20 1258  INR 1.0   Cardiac Enzymes: No results for input(s): CKTOTAL, CKMB, CKMBINDEX, TROPONINI in the last 168 hours. BNP (last 3 results) No results for input(s): PROBNP in the last 8760 hours. HbA1C: No results for input(s): HGBA1C in the last 72 hours. CBG: Recent Labs  Lab 08/13/20 1535  GLUCAP 112*   Lipid Profile: No results for input(s): CHOL, HDL, LDLCALC, TRIG, CHOLHDL, LDLDIRECT in the last 72 hours. Thyroid Function Tests: No results for input(s): TSH, T4TOTAL, FREET4, T3FREE, THYROIDAB in the last 72 hours. Anemia Panel: Recent Labs    08/15/20 0142  VITAMINB12 299  TIBC 283  IRON 69   Sepsis Labs: Recent Labs  Lab 08/13/20 1258 08/13/20 1509 08/13/20 1847 08/13/20 2124  LATICACIDVEN 5.1* 3.5* 3.1* 3.3*    Recent Results (from the past 240 hour(s))  Resp Panel by RT-PCR (Flu A&B, Covid) Nasopharyngeal Swab     Status: None   Collection Time: 08/13/20  2:02 PM   Specimen: Nasopharyngeal Swab; Nasopharyngeal(NP) swabs in vial transport medium  Result Value Ref Range Status   SARS Coronavirus 2 by RT PCR NEGATIVE NEGATIVE Final    Comment: (NOTE) SARS-CoV-2  target nucleic acids are NOT DETECTED.  The SARS-CoV-2 RNA is generally detectable in upper respiratory specimens during the acute phase of infection. The lowest concentration of SARS-CoV-2 viral copies this assay can detect is 138 copies/mL. A negative result does not preclude SARS-Cov-2 infection and should not be used as the sole basis for treatment or other patient management decisions. A negative result may occur with  improper specimen collection/handling, submission of specimen other than nasopharyngeal swab, presence of viral mutation(s) within the areas targeted by this assay, and inadequate number of viral copies(<138 copies/mL). A negative result must be combined with  clinical observations, patient history, and epidemiological information. The expected result is Negative.  Fact Sheet for Patients:  BloggerCourse.com  Fact Sheet for Healthcare Providers:  SeriousBroker.it  This test is no t yet approved or cleared by the [place] FDA and  has been authorized for detection and/or diagnosis of SARS-CoV-2 by FDA under an Emergency Use Authorization (EUA). This EUA will remain  in effect (meaning this test can be used) for the duration of the COVID-19 declaration under Section 564(b)(1) of the Act, 21 U.S.C.section 360bbb-3(b)(1), unless the authorization is terminated  or revoked sooner.       Influenza A by PCR NEGATIVE NEGATIVE Final   Influenza B by PCR NEGATIVE NEGATIVE Final    Comment: (NOTE) The Xpert Xpress SARS-CoV-2/FLU/RSV plus assay is intended as an aid in the diagnosis of influenza from Nasopharyngeal swab specimens and should not be used as a sole basis for treatment. Nasal washings and aspirates are unacceptable for Xpert Xpress SARS-CoV-2/FLU/RSV testing.  Fact Sheet for Patients: BloggerCourse.com  Fact Sheet for Healthcare  Providers: SeriousBroker.it  This test is not yet approved or cleared by the [place] FDA and has been authorized for detection and/or diagnosis of SARS-CoV-2 by FDA under an Emergency Use Authorization (EUA). This EUA will remain in effect (meaning this test can be used) for the duration of the COVID-19 declaration under Section 564(b)(1) of the Act, 21 U.S.C. section 360bbb-3(b)(1), unless the authorization is terminated or revoked.  Performed at [facility], [address] [place], [place] [address]   MRSA PCR Screening     Status: None   Collection Time: 08/13/20  3:40 PM   Specimen: Nasal Mucosa; Nasopharyngeal  Result Value Ref Range Status   MRSA by PCR NEGATIVE NEGATIVE Final    Comment:        The GeneXpert MRSA Assay (FDA approved for NASAL specimens only), is one component of a comprehensive MRSA colonization surveillance program. It is not intended to diagnose MRSA infection nor to guide or monitor treatment for MRSA infections. Performed at [facility], [address] [place], [place] [address]          Radiology Studies: No results found.      Scheduled Meds:  Chlorhexidine Gluconate Cloth  6 each Topical Daily   gabapentin  600 mg Oral TID   metoprolol succinate  25 mg Oral Daily   OXcarbazepine  300 mg Oral TID   pantoprazole  40 mg Oral Daily   rosuvastatin  40 mg Oral Daily   Continuous Infusions:  methocarbamol (ROBAXIN) IV       LOS: 3 days    Time spent: 27 minutes    Marrion Coy, MD [facility]   To contact the attending provider between 7A-7P or the covering provider during after hours 7P-7A, please log into the web site [email or web address] and access using universal [place] password for that web site. If you do not have the password, please call the hospital operator.  08/16/2020, 12:21 PM

## 2020-08-17 LAB — TYPE AND SCREEN
ABO/RH(D): A POS
Antibody Screen: NEGATIVE
Unit division: 0

## 2020-08-17 LAB — HEMOGLOBIN AND HEMATOCRIT, BLOOD
HCT: 26.6 % — ABNORMAL LOW (ref 39.0–52.0)
Hemoglobin: 9 g/dL — ABNORMAL LOW (ref 13.0–17.0)

## 2020-08-17 LAB — BPAM RBC
Blood Product Expiration Date: 202207162359
ISSUE DATE / TIME: 202206160256
Unit Type and Rh: 6200

## 2020-08-17 LAB — HOMOCYSTEINE: Homocysteine: 10.8 umol/L (ref 0.0–14.5)

## 2020-08-17 MED ORDER — VITAMIN B-12 1000 MCG PO TABS: 1000.0000 ug | ORAL_TABLET | Freq: Every day | ORAL | 0 refills | Status: AC

## 2020-08-17 MED ORDER — CYANOCOBALAMIN 1000 MCG/ML IJ SOLN
1000.0000 ug | Freq: Once | INTRAMUSCULAR | Status: AC
  Administered 2020-08-17: 1000 ug via INTRAMUSCULAR
  Filled 2020-08-17 (×2): qty 1

## 2020-08-17 NOTE — Progress Notes (Signed)
Christopher Beck to be D/C'd Home per MD order.  Discussed prescriptions and follow up appointments with the patient. Prescriptions given to patient, medication list explained in detail. Pt verbalized understanding.   IV catheter discontinued intact. Site without signs and symptoms of complications. Dressing and pressure applied. Pt denies pain at this time. No complaints noted.  An After Visit Summary was printed and given to the patient. Pt awaiting transportation. Per pt will call [facility] and instructed to call primary RN when transportation is here.  Rigoberto Noel

## 2020-08-17 NOTE — Discharge Summary (Addendum)
Physician Discharge Summary  Patient ID: Christopher Beck MRN: [id number] DOB/AGE: [birth year] 55 y.o.  Admit date: 08/13/2020 Discharge date: 08/17/2020  Admission Diagnoses:  Discharge Diagnoses:  Principal Problem:   Nontraumatic retroperitoneal hematoma Active Problems:   CAD (coronary artery disease)   Trigeminal neuralgia of left side of face   Acute blood loss anemia   Discharged Condition: good  Hospital Course:  Christopher Beck is a 55 y.o. male with medical history significant for trigeminal neuralgia, status post recent left radiofrequency ablation for trigeminal neuralgia (05/03) coronary artery disease status post acute ST elevation MI with complications of complete heart block/V. Fib/V. tach requiring CPR/intubation and a temporary pacemaker placement, status post PCI with DES to RCA and mid LAD, history of hypertension who presents to the emergency room for evaluation of back pain mostly left-sided flank and left lower abdominal pain with radiation to his groin.   CTA ab/pel found with retroperitoneal hematoma. Vascular surgery following, rec. Conservative therapy.   #1.  Nontraumatic retroperitoneal hematoma. Acute blood loss anemia. Transient hypotension. Patient is evaluated by vascular surgery, does not recommend any surgery.  Patient hemoglobin dropped down to as low as 6.9, required PRBC transfusion.  He also has a borderline B12 level at 226, received 2 injections.  Stain still pending, I will continue B12 supplement for now. Patient condition had improved, hemoglobin increased to 9.0 today.  He is medically stable to be discharged.  2.  Coronary artery disease status post PCI and stents. Patient evaluated by cardiology, deemed okay to hold off antiplatelets treatment.  Patient cardiac stents was performed a year ago per cardiology.  Patient is advised to go back to cardiology in [facility] in 1 to 2 weeks, may consider restart antiplatelet treatment when  patient current condition resolves.  3.  Acute kidney injury Renal function improved  4.  Trigeminal neuralgia.    Consults: vascular surgery  Significant Diagnostic Studies:  CTA ABDOMEN AND PELVIS WITH CONTRAST   TECHNIQUE: Multidetector CT imaging of the abdomen and pelvis was performed using the standard protocol during bolus administration of intravenous contrast. Multiplanar reconstructed images and MIPs were obtained and reviewed to evaluate the vascular anatomy.   CONTRAST:  OMNIPAQUE IOHEXOL 350 MG/ML SOLN   COMPARISON:  None.   FINDINGS: VASCULAR   Aorta: Minimal plaque in the infrarenal segment. No aneurysm, dissection, or stenosis.   Celiac: Patent without evidence of aneurysm, dissection, vasculitis or significant stenosis.   SMA: Patent without evidence of aneurysm, dissection, vasculitis or significant stenosis.   Renals: Single left, widely patent. Single right, with calcified ostial plaque resulting in stenosis over length of approximately 1.6 cm mild hemodynamic significance, patent distally.   IMA: Patent without evidence of aneurysm, dissection, vasculitis or significant stenosis.   Inflow: Eccentric partially calcified plaque in the proximal left common iliac artery resulting in approximately 50% diameter short-segment stenosis.   Proximal Outflow: Bilateral common femoral and visualized portions of the superficial and profunda femoral arteries are patent without evidence of aneurysm, dissection, vasculitis or significant stenosis.   Veins: Patent hepatic veins, portal vein, SMV, splenic vein, bilateral renal veins, iliac venous system and IVC. There is tapered narrowing of the left common iliac vein posterior to the left common iliac artery.   There is active extravasation into a left retroperitoneal hematoma from a small muscular branch.   Review of the MIP images confirms the above findings.   NON-VASCULAR   Lower chest:  Coronary calcifications. No pleural or pericardial  effusion.   Hepatobiliary: Subcentimeter hyperdensity in the dependent aspect of the nondilated gallbladder suggesting a partially calcified stone. No focal liver lesion or biliary ductal dilatation.   Pancreas: Unremarkable. No pancreatic ductal dilatation or surrounding inflammatory changes.   Spleen: Normal in size without focal abnormality.   Adrenals/Urinary Tract: Adrenal glands are unremarkable. Kidneys are normal, without renal calculi, focal lesion, or hydronephrosis. Bladder is unremarkable.   Stomach/Bowel: Stomach is nondistended. Small bowel decompressed. Normal appendix. Colon is nondilated, unremarkable.   Lymphatic: No abdominal or pelvic adenopathy.   Reproductive: Mild prostate enlargement.   Other: Left retroperitoneal hematoma measuring approximately 9.7 x 7.3 cm maximum transverse dimensions, 7.2 cm craniocaudal. Active extravasation from a small muscular branch is noted at the posterior aspect of the collection in the iliac fossa (Im129,Se4) . no ascites. No free air.   Musculoskeletal: No acute or significant osseous findings.   IMPRESSION: 1. 9.7 cm left retroperitoneal hematoma that active extravasation from a small muscular branch. 2. Aortic Atherosclerosis (ICD10-170.0) without aneurysm, dissection, or stenosis. 3. Cholelithiasis     Electronically Signed   By: Corlis Leak M.D.   On: 08/13/2020 14:18   Treatments: Transfusion, b12  Discharge Exam: Blood pressure (!) 119/98, pulse 71, temperature 98.7 F (37.1 C), temperature source Oral, resp. rate 15, height 5\' 7"  (1.702 m), weight 68 kg, SpO2 99 %. General appearance: alert and cooperative Resp: clear to auscultation bilaterally Cardio: regular rate and rhythm, S1, S2 normal, no murmur, click, rub or gallop GI: soft, non-tender; bowel sounds normal; no masses,  no organomegaly and ecchymosis at the left flank. Extremities: extremities  normal, atraumatic, no cyanosis or edema  Disposition: Discharge disposition: 01-Home or Self Care       Discharge Instructions     Diet - low sodium heart healthy   Complete by: As directed    Increase activity slowly   Complete by: As directed       Allergies as of 08/17/2020   No Known Allergies      Medication List     STOP taking these medications    aspirin 81 MG chewable tablet   hydrochlorothiazide 12.5 MG tablet Commonly known as: HYDRODIURIL   lisinopril 10 MG tablet Commonly known as: ZESTRIL   prasugrel 10 MG Tabs tablet Commonly known as: EFFIENT       TAKE these medications    amLODipine 10 MG tablet Commonly known as: NORVASC Take 10 mg by mouth daily.   empagliflozin 10 MG Tabs tablet Commonly known as: JARDIANCE Take 10 mg by mouth daily.   gabapentin 600 MG tablet Commonly known as: NEURONTIN Take 600 mg by mouth 3 (three) times daily.   metoprolol succinate 25 MG 24 hr tablet Commonly known as: TOPROL-XL Take 25 mg by mouth daily.   OXcarbazepine 150 MG tablet Commonly known as: TRILEPTAL Take 300 mg by mouth 3 (three) times daily.   rosuvastatin 40 MG tablet Commonly known as: CRESTOR Take 40 mg by mouth daily.   vitamin B-12 1000 MCG tablet Commonly known as: CYANOCOBALAMIN Take 1 tablet (1,000 mcg total) by mouth daily.        Follow-up Information     08/19/2020, MD Follow up.   Specialty: Family Medicine Contact information: [address] [place] [phone]                32 minutes Signed: [phone] 08/17/2020, 9:57 AM

## 2020-08-17 NOTE — Plan of Care (Signed)
  Problem: Clinical Measurements: Goal: Ability to maintain clinical measurements within normal limits will improve Outcome: Progressing Goal: Will remain free from infection Outcome: Progressing Goal: Diagnostic test results will improve Outcome: Progressing Goal: Respiratory complications will improve Outcome: Progressing Goal: Cardiovascular complication will be avoided Outcome: Progressing   Problem: Pain Managment: Goal: General experience of comfort will improve Outcome: Progressing   Pt is involved in and agrees with the plan of care. V/S stable. Reports pain on his left groin and back; Dilaudid IV prn given every 3 hrs. Still has Large bruise on his left side towards his groin. Complained of eye irritation on left eye, eye drops instilled.

## 2020-12-12 ENCOUNTER — Ambulatory Visit: Payer: Self-pay | Admitting: Psychiatry

## 2020-12-17 ENCOUNTER — Other Ambulatory Visit: Payer: Self-pay

## 2020-12-17 ENCOUNTER — Ambulatory Visit: Payer: Federal, State, Local not specified - PPO | Admitting: Licensed Clinical Social Worker

## 2022-11-05 ENCOUNTER — Emergency Department: Payer: Medicaid Other

## 2022-11-05 ENCOUNTER — Encounter: Payer: Self-pay | Admitting: Internal Medicine

## 2022-11-05 ENCOUNTER — Inpatient Hospital Stay
Admission: EM | Admit: 2022-11-05 | Discharge: 2022-11-07 | DRG: 303 | Disposition: A | Discharge status: Home / Self Care | Payer: Medicaid Other | Attending: Internal Medicine | Admitting: Internal Medicine

## 2022-11-05 ENCOUNTER — Other Ambulatory Visit: Payer: Self-pay

## 2022-11-05 DIAGNOSIS — E785 Hyperlipidemia, unspecified: Secondary | ICD-10-CM | POA: Diagnosis present

## 2022-11-05 DIAGNOSIS — I2089 Other forms of angina pectoris: Secondary | ICD-10-CM | POA: Diagnosis not present

## 2022-11-05 DIAGNOSIS — I25110 Atherosclerotic heart disease of native coronary artery with unstable angina pectoris: Secondary | ICD-10-CM | POA: Diagnosis present

## 2022-11-05 DIAGNOSIS — I252 Old myocardial infarction: Secondary | ICD-10-CM | POA: Diagnosis not present

## 2022-11-05 DIAGNOSIS — I2510 Atherosclerotic heart disease of native coronary artery without angina pectoris: Secondary | ICD-10-CM | POA: Diagnosis present

## 2022-11-05 DIAGNOSIS — Z87891 Personal history of nicotine dependence: Secondary | ICD-10-CM | POA: Diagnosis not present

## 2022-11-05 DIAGNOSIS — Z8674 Personal history of sudden cardiac arrest: Secondary | ICD-10-CM

## 2022-11-05 DIAGNOSIS — I10 Essential (primary) hypertension: Secondary | ICD-10-CM | POA: Diagnosis present

## 2022-11-05 DIAGNOSIS — Z79899 Other long term (current) drug therapy: Secondary | ICD-10-CM | POA: Diagnosis not present

## 2022-11-05 DIAGNOSIS — R001 Bradycardia, unspecified: Secondary | ICD-10-CM | POA: Diagnosis present

## 2022-11-05 DIAGNOSIS — R079 Chest pain, unspecified: Principal | ICD-10-CM

## 2022-11-05 DIAGNOSIS — I200 Unstable angina: Secondary | ICD-10-CM | POA: Diagnosis not present

## 2022-11-05 DIAGNOSIS — I959 Hypotension, unspecified: Secondary | ICD-10-CM | POA: Diagnosis present

## 2022-11-05 DIAGNOSIS — Z7984 Long term (current) use of oral hypoglycemic drugs: Secondary | ICD-10-CM

## 2022-11-05 DIAGNOSIS — J449 Chronic obstructive pulmonary disease, unspecified: Secondary | ICD-10-CM | POA: Diagnosis present

## 2022-11-05 DIAGNOSIS — Z7982 Long term (current) use of aspirin: Secondary | ICD-10-CM

## 2022-11-05 DIAGNOSIS — Z1152 Encounter for screening for COVID-19: Secondary | ICD-10-CM | POA: Diagnosis not present

## 2022-11-05 DIAGNOSIS — Z955 Presence of coronary angioplasty implant and graft: Secondary | ICD-10-CM | POA: Diagnosis not present

## 2022-11-05 DIAGNOSIS — Z7902 Long term (current) use of antithrombotics/antiplatelets: Secondary | ICD-10-CM

## 2022-11-05 LAB — COMPREHENSIVE METABOLIC PANEL
ALT: 22 U/L (ref 0–44)
AST: 27 U/L (ref 15–41)
Albumin: 4.3 g/dL (ref 3.5–5.0)
Alkaline Phosphatase: 56 U/L (ref 38–126)
Anion gap: 9 (ref 5–15)
BUN: 22 mg/dL — ABNORMAL HIGH (ref 6–20)
CO2: 25 mmol/L (ref 22–32)
Calcium: 9.1 mg/dL (ref 8.9–10.3)
Chloride: 102 mmol/L (ref 98–111)
Creatinine, Ser: 0.9 mg/dL (ref 0.61–1.24)
GFR, Estimated: >60 mL/min (ref >60)
Glucose, Bld: 128 mg/dL — ABNORMAL HIGH (ref 70–99)
Potassium: 4 mmol/L (ref 3.5–5.1)
Sodium: 136 mmol/L (ref 135–145)
Total Bilirubin: 0.6 mg/dL (ref 0.3–1.2)
Total Protein: 7.1 g/dL (ref 6.5–8.1)

## 2022-11-05 LAB — CBC
HCT: 37.3 % — ABNORMAL LOW (ref 39.0–52.0)
Hemoglobin: 12.4 g/dL — ABNORMAL LOW (ref 13.0–17.0)
MCH: 29.2 pg (ref 26.0–34.0)
MCHC: 33.2 g/dL (ref 30.0–36.0)
MCV: 87.8 fL (ref 80.0–100.0)
Platelets: 227 10*3/uL (ref 150–400)
RBC: 4.25 MIL/uL (ref 4.22–5.81)
RDW: 13.7 % (ref 11.5–15.5)
WBC: 5.7 10*3/uL (ref 4.0–10.5)
nRBC: 0 % (ref 0.0–0.2)

## 2022-11-05 LAB — APTT: aPTT: 27 s (ref 24–36)

## 2022-11-05 LAB — HEPARIN LEVEL (UNFRACTIONATED): Heparin Unfractionated: 0.44 [IU]/mL (ref 0.30–0.70)

## 2022-11-05 LAB — PROTIME-INR
INR: 1 (ref 0.8–1.2)
Prothrombin Time: 13.2 s (ref 11.4–15.2)

## 2022-11-05 LAB — TROPONIN I (HIGH SENSITIVITY)
Troponin I (High Sensitivity): 4 ng/L (ref <18)
Troponin I (High Sensitivity): 15 ng/L (ref <18)

## 2022-11-05 MED ORDER — ASPIRIN 81 MG PO TBEC
81.0000 mg | DELAYED_RELEASE_TABLET | Freq: Every day | ORAL | Status: DC
  Administered 2022-11-06 – 2022-11-07 (×2): 81 mg via ORAL
  Filled 2022-11-05 (×2): qty 1

## 2022-11-05 MED ORDER — HEPARIN BOLUS VIA INFUSION
3800.0000 [IU] | Freq: Once | INTRAVENOUS | Status: AC
  Administered 2022-11-05: 3800 [IU] via INTRAVENOUS
  Filled 2022-11-05: qty 3800

## 2022-11-05 MED ORDER — ASPIRIN 81 MG PO TBEC: 81.0000 mg | DELAYED_RELEASE_TABLET | Freq: Every day | ORAL | Status: DC

## 2022-11-05 MED ORDER — MORPHINE SULFATE (PF) 4 MG/ML IV SOLN
4.0000 mg | Freq: Once | INTRAVENOUS | Status: AC
  Administered 2022-11-05: 4 mg via INTRAVENOUS
  Filled 2022-11-05: qty 1

## 2022-11-05 MED ORDER — METOPROLOL SUCCINATE ER 50 MG PO TB24: 25.0000 mg | ORAL_TABLET | Freq: Every day | ORAL | Status: DC

## 2022-11-05 MED ORDER — ASPIRIN 300 MG RE SUPP: 300.0000 mg | RECTAL | Status: AC

## 2022-11-05 MED ORDER — HEPARIN (PORCINE) 25000 UT/250ML-% IV SOLN
750.0000 [IU]/h | INTRAVENOUS | Status: DC
  Administered 2022-11-05 – 2022-11-06 (×2): 750 [IU]/h via INTRAVENOUS
  Filled 2022-11-05 (×2): qty 250

## 2022-11-05 MED ORDER — PANTOPRAZOLE SODIUM 40 MG PO TBEC
40.0000 mg | DELAYED_RELEASE_TABLET | Freq: Every day | ORAL | Status: DC
  Administered 2022-11-05 – 2022-11-07 (×3): 40 mg via ORAL
  Filled 2022-11-05 (×3): qty 1

## 2022-11-05 MED ORDER — NITROGLYCERIN 0.4 MG SL SUBL: 0.4000 mg | SUBLINGUAL_TABLET | SUBLINGUAL | Status: DC | PRN

## 2022-11-05 MED ORDER — ACETAMINOPHEN 325 MG PO TABS
650.0000 mg | ORAL_TABLET | ORAL | Status: DC | PRN
  Administered 2022-11-05 – 2022-11-06 (×4): 650 mg via ORAL
  Filled 2022-11-05 (×4): qty 2

## 2022-11-05 MED ORDER — GABAPENTIN 300 MG PO CAPS
600.0000 mg | ORAL_CAPSULE | Freq: Three times a day (TID) | ORAL | Status: DC
  Administered 2022-11-05 – 2022-11-07 (×5): 600 mg via ORAL
  Filled 2022-11-05 (×5): qty 2

## 2022-11-05 MED ORDER — EMPAGLIFLOZIN 10 MG PO TABS: 10.0000 mg | ORAL_TABLET | Freq: Every day | ORAL | Status: DC

## 2022-11-05 MED ORDER — ONDANSETRON HCL 4 MG/2ML IJ SOLN: 4.0000 mg | Freq: Four times a day (QID) | INTRAMUSCULAR | Status: DC | PRN

## 2022-11-05 MED ORDER — ISOSORBIDE MONONITRATE ER 30 MG PO TB24
15.0000 mg | ORAL_TABLET | Freq: Every day | ORAL | Status: DC
  Administered 2022-11-05 – 2022-11-07 (×2): 15 mg via ORAL
  Filled 2022-11-05 (×3): qty 1

## 2022-11-05 MED ORDER — ASPIRIN 81 MG PO CHEW
324.0000 mg | CHEWABLE_TABLET | ORAL | Status: AC
  Administered 2022-11-05: 324 mg via ORAL
  Filled 2022-11-05: qty 4

## 2022-11-05 MED ORDER — AMLODIPINE BESYLATE 5 MG PO TABS: 10.0000 mg | ORAL_TABLET | Freq: Every day | ORAL | Status: DC

## 2022-11-05 MED ORDER — ROSUVASTATIN CALCIUM 10 MG PO TABS
40.0000 mg | ORAL_TABLET | Freq: Every day | ORAL | Status: DC
  Administered 2022-11-05 – 2022-11-06 (×2): 40 mg via ORAL
  Filled 2022-11-05 (×2): qty 2
  Filled 2022-11-05: qty 4

## 2022-11-05 MED ORDER — CLOPIDOGREL BISULFATE 75 MG PO TABS
75.0000 mg | ORAL_TABLET | Freq: Every day | ORAL | Status: DC
  Administered 2022-11-05 – 2022-11-07 (×3): 75 mg via ORAL
  Filled 2022-11-05 (×3): qty 1

## 2022-11-05 MED ORDER — OXCARBAZEPINE 300 MG PO TABS: 300.0000 mg | ORAL_TABLET | Freq: Three times a day (TID) | ORAL | Status: DC

## 2022-11-05 NOTE — ED Provider Notes (Signed)
[facility] Provider Note    Event Date/Time   First MD Initiated Contact with Patient 11/05/22 1122     (approximate)   History   Chest Pain   HPI Christopher Beck is a 57 y.o. male 57 year old male with CAD s/p PCI x 4, chronic Suboxone use presented today for chest pain.  Patient has noted intermittent chest pain symptoms over the past 2 weeks.  He reports 3 specific "attacks".  Today chest pain started approximately 1 hour before arrival.  He took sublingual nitro without significant relief.  Already received aspirin 324 with EMS.  When the event for started he noted some nausea, lightheadedness, and feeling hot.  Those symptoms have resolved and the chest pressure in the middle this is all that is left at this time.  He did note some radiation into his arms when it first happened.  Otherwise denying all other symptoms outside the chest pressure at this time.     Physical Exam   Triage Vital Signs: ED Triage Vitals  Encounter Vitals Group     BP      Systolic BP Percentile      Diastolic BP Percentile      Pulse      Resp      Temp      Temp src      SpO2      Weight      Height      Head Circumference      Peak Flow      Pain Score      Pain Loc      Pain Education      Exclude from Growth Chart     Most recent vital signs: Vitals:   11/05/22 1430 11/05/22 1432  BP: 109/74 109/74  Pulse: (!) 57 (!) 56  Resp: 17 17  Temp:    SpO2:  100%    Physical Exam: I have reviewed the vital signs and nursing notes. General: Awake, alert, no acute distress.  Anxious appearing. Head:  Atraumatic, normocephalic.   ENT:  EOM intact, PERRL. Oral mucosa is pink and moist with no lesions. Neck: Neck is supple with full range of motion, No meningeal signs. Cardiovascular:  RRR, No murmurs. Peripheral pulses palpable and equal bilaterally. Respiratory:  Symmetrical chest wall expansion.  No rhonchi, rales, or wheezes.  Good air movement throughout.   No use of accessory muscles.   Musculoskeletal:  No cyanosis or edema. Moving extremities with full ROM Abdomen:  Soft, nontender, nondistended. Neuro:  GCS 15, moving all four extremities, interacting appropriately. Speech clear. Psych:  Calm, appropriate.   Skin:  Warm, dry, no rash.    ED Results / Procedures / Treatments   Labs (all labs ordered are listed, but only abnormal results are displayed) Labs Reviewed  CBC - Abnormal; Notable for the following components:      Result Value   Hemoglobin 12.4 (*)    HCT 37.3 (*)    All other components within normal limits  COMPREHENSIVE METABOLIC PANEL - Abnormal; Notable for the following components:   Glucose, Bld 128 (*)    BUN 22 (*)    All other components within normal limits  APTT  PROTIME-INR  HEPARIN LEVEL (UNFRACTIONATED)  TROPONIN I (HIGH SENSITIVITY)  TROPONIN I (HIGH SENSITIVITY)     EKG My EKG interpretation: Rate of 54, normal sinus rhythm, normal axis.  No acute ST elevation or depression seen at this time.  RADIOLOGY Independently interpreted chest x-ray with no acute pathology   PROCEDURES:  Critical Care performed: No  Procedures   MEDICATIONS ORDERED IN ED: Medications  heparin bolus via infusion 3,800 Units (has no administration in time range)    Followed by  heparin ADULT infusion 100 units/mL (25000 units/277mL) (has no administration in time range)  morphine (PF) 4 MG/ML injection 4 mg (4 mg Intravenous Given 11/05/22 1155)     IMPRESSION / MDM / ASSESSMENT AND PLAN / ED COURSE  I reviewed the triage vital signs and the nursing notes.                              Differential diagnosis includes, but is not limited to, ACS, pneumonia, pneumothorax, reflux.  Patient's presentation is most consistent with severe exacerbation of chronic illness.  Patient is a 57 year old male presenting today for chest pressure associated with nausea, diaphoresis concerning for ACS with his prior history  of the same.  EKG with no evidence of a STEMI.  Chest x-ray unremarkable.  Initial troponin of 4.  Patient had no relief with nitro with EMS and was given morphine here with some relief but chest pressure symptoms persisted.  Repeat troponin of 15.  Given significant ACS history in the past, discussed case with Dr. Juliann Pares with cardiology.  He agrees that with his history, patient would benefit from inpatient admission for further evaluation, continue trending troponins, and ongoing ACS management.  Patient was admitted to hospitalist service for further care.  Patient was started on heparin drip.  The patient is on the cardiac monitor to evaluate for evidence of arrhythmia and/or significant heart rate changes. Clinical Course as of 11/05/22 1515  Wed Nov 05, 2022  1205 CBC(!) Unremarkable [DW]  1246 Troponin I (High Sensitivity): 4 [DW]  1246 No acute pathology per my interpretation [DW]  1256 Reassessed patient.  No ongoing chest pain symptoms at this time. [DW]  1433 Troponin I (High Sensitivity): 15 [DW]  1439 Patient noting some mild chest pressure on reassessment.  Troponins did go from 4-15.  Will talk with cardiology for further evaluation. [DW]  1442 Heparin drip and bolus. Admit to hospitalist. Have them consult cardiology again [DW]    Clinical Course User Index [DW] Janith Lima, MD     FINAL CLINICAL IMPRESSION(S) / ED DIAGNOSES   Final diagnoses:  Chest pain, unspecified type  Coronary artery disease with unstable angina pectoris, unspecified vessel or lesion type, unspecified whether native or transplanted heart [facility])     Rx / DC Orders   ED Discharge Orders     None        Note:  This document was prepared using Dragon voice recognition software and may include unintentional dictation errors.   Janith Lima, MD 11/05/22 [phone]

## 2022-11-05 NOTE — ED Triage Notes (Signed)
pt in via [facility] c/ CP about 1 hr ago.. Pt states that midsternal CP that radiates down into both arms. Has had intermintent CP x2 weeks. Hx of MI with 4 stent placements. Pt endorses that this episode feels like previous MI. Had nitro and aspirin pror to EMS arrival had 2 sublinguial sprays after per EMS.  Vitals Per EMS: BP- 155/95 BS 121

## 2022-11-05 NOTE — ED Notes (Signed)
Pt still states that aching feeling is still present on the right side of chest and into right arm. Pt rates a 1/10 on the pain scale.

## 2022-11-05 NOTE — Consult Note (Signed)
ANTICOAGULATION CONSULT NOTE - Initial Consult  Pharmacy Consult for IV Heparin Indication:  "VTE treatment" - per triage notes here with chest pain - clarified as ACS treatment with EDP  Patient Measurements: Height: 5\' 7"  (170.2 cm) Weight: 63.5 kg (140 lb) IBW/kg (Calculated) : 66.1 Heparin Dosing Weight: 63.5 kg  Labs: Recent Labs    11/05/22 1131 11/05/22 1332  HGB 12.4*  --   HCT 37.3*  --   PLT 227  --   CREATININE 0.90  --   TROPONINIHS 4 15    Estimated Creatinine Clearance: 82.3 mL/min (by C-G formula based on SCr of 0.9 mg/dL).   Medical History: Past Medical History:  Diagnosis Date   Hypertension    Lumbar stress fracture     Medications:  No anticoagulation prior to admission per my chart review. Medication reconciliation is pending.   Assessment: 57 y/o M presenting to the ED 9/4 with chest pain. Has history of MI s/p multiple stents per triage note. Pharmacy consulted to initiate and manage heparin infusion.  Baseline H&H overall within normal limits. Baseline aPTT and PT-INR are pending.  Goal of Therapy:  Heparin level 0.3-0.7 units/ml Monitor platelets by anticoagulation protocol: Yes   Plan:  --Heparin 3800 unit IV bolus followed by continuous infusion at 750 units/hr --Heparin level 6 hours from initiation of infusion --Daily CBC per protocol while on IV heparin  Tressie Ellis 11/05/2022,2:51 PM

## 2022-11-05 NOTE — Consult Note (Signed)
ANTICOAGULATION CONSULT NOTE  Pharmacy Consult for IV Heparin Indication:  "VTE treatment" - per triage notes here with chest pain - clarified as ACS treatment with EDP  Patient Measurements: Height: 5\' 7"  (170.2 cm) Weight: 63.5 kg (140 lb) IBW/kg (Calculated) : 66.1 Heparin Dosing Weight: 63.5 kg  Labs: Recent Labs    11/05/22 1131 11/05/22 1332 11/05/22 1548 11/05/22 2234  HGB 12.4*  --   --   --   HCT 37.3*  --   --   --   PLT 227  --   --   --   APTT  --   --  27  --   LABPROT  --   --  13.2  --   INR  --   --  1.0  --   HEPARINUNFRC  --   --   --  0.44  CREATININE 0.90  --   --   --   TROPONINIHS 4 15  --   --     Estimated Creatinine Clearance: 82.3 mL/min (by C-G formula based on SCr of 0.9 mg/dL).   Medical History: Past Medical History:  Diagnosis Date   Hypertension    Lumbar stress fracture     Medications:  No anticoagulation prior to admission per my chart review. Medication reconciliation is pending.   Assessment: 57 y/o M presenting to the ED 9/4 with chest pain. Has history of MI s/p multiple stents per triage note. Pharmacy consulted to initiate and manage heparin infusion.  Baseline H&H overall within normal limits. Baseline aPTT and PT-INR are pending.  Goal of Therapy:  Heparin level 0.3-0.7 units/ml Monitor platelets by anticoagulation protocol: Yes   09/04 2234 HL 0.44, therapeutic x 1  Plan:  --Continue heparin infusion at 750 units/hr --Recheck HL w/ AM labs to confirm --Daily CBC per protocol while on IV heparin  Otelia Sergeant, PharmD, [facility] 11/05/2022 11:13 PM

## 2022-11-05 NOTE — H&P (Signed)
History and Physical    Christopher Beck WGN:[id number] DOB: 1965/12/20 DOA: 11/05/2022  PCP: Laurine Blazer, MD (Confirm with patient/family/NH records and if not entered, this has to be entered at [facility] point of entry) Patient coming from: Home  I have personally briefly reviewed patient's old medical records in [facility]  Chief Complaint: Chest pain  HPI: Christopher Beck is a 57 y.o. male with medical history significant of CAD, STEMI s/p RCA stenting and staged LAD stenting, HTN, chronic trigeminal neuralgia, presented with new onset of chest pains.  Patient reported that for the last 7 days he has had several episodes of chest pains, 1 episode happened in the morning between breakfast and lunchtime.  He described as aching like, in the middle of the chest radiating to bilateral forearms and 10 fingers, usually lasted for 30 minutes - 1 hour, and responded to nitroglycerin.  This morning however had a more severe episode with 8/10 chest pain " almost like indigestion", still aching-like, radiating to bilateral 10 fingers, associated with shortness of breath and this time he tried nitroglycerin x 3 with no improvement and called EMS.  EMS gave him 2 nitroglycerin sprays but still no significant improvement of chest pains.  Patient reported that 2 months ago he presented with hemorrhagic shock secondary to nontraumatic retroperitoneal hematoma and aspirin Plavix was held for several weeks but his cardiologist at [facility] resume aspirin and Plavix soon afterward.  ED Course: Borderline bradycardia, nonhypotensive nonhypoxic.  EKG showed no acute ST changes.  Troponin trending 4> 15.  Cardiology consulted, patient started on heparin drip.  Review of Systems: As per HPI otherwise 14 point review of systems negative.    Past Medical History:  Diagnosis Date   Hypertension    Lumbar stress fracture     Past Surgical History:  Procedure Laterality Date   FRACTURE SURGERY       reports  that he quit smoking about 4 years ago. His smoking use included cigarettes. He has never used smokeless tobacco. He reports that he does not currently use drugs. He reports that he does not drink alcohol.  No Known Allergies  Family History  Problem Relation Age of Onset   Healthy Mother    Healthy Father      Prior to Admission medications   Medication Sig Start Date End Date Taking? Authorizing Provider  amLODipine (NORVASC) 10 MG tablet Take 10 mg by mouth daily.    [provider]  empagliflozin (JARDIANCE) 10 MG TABS tablet Take 10 mg by mouth daily.    [provider]  gabapentin (NEURONTIN) 600 MG tablet Take 600 mg by mouth 3 (three) times daily. 07/31/20   [provider]  metoprolol succinate (TOPROL-XL) 25 MG 24 hr tablet Take 25 mg by mouth daily.    [provider]  OXcarbazepine (TRILEPTAL) 150 MG tablet Take 300 mg by mouth 3 (three) times daily.    [provider]  rosuvastatin (CRESTOR) 40 MG tablet Take 40 mg by mouth daily. 06/06/20   [provider]  albuterol (PROVENTIL HFA;VENTOLIN HFA) 108 (90 Base) MCG/ACT inhaler Inhale 1-2 puffs into the lungs every 6 (six) hours as needed for wheezing or shortness of breath. 05/30/16 08/31/18  Payton Mccallum, MD    Physical Exam: Vitals:   11/05/22 1330 11/05/22 1400 11/05/22 1430 11/05/22 1432  BP: 114/81 117/84 109/74 109/74  Pulse: (!) 57 (!) 51 (!) 57 (!) 56  Resp: 15 15 17 17   Temp:  TempSrc:      SpO2:    100%  Weight:      Height:        Constitutional: NAD, calm, comfortable Vitals:   11/05/22 1330 11/05/22 1400 11/05/22 1430 11/05/22 1432  BP: 114/81 117/84 109/74 109/74  Pulse: (!) 57 (!) 51 (!) 57 (!) 56  Resp: 15 15 17 17   Temp:      TempSrc:      SpO2:    100%  Weight:      Height:       Eyes: PERRL, lids and conjunctivae normal ENMT: Mucous membranes are moist. Posterior pharynx clear of any exudate or lesions.Normal dentition.  Neck:  normal, supple, no masses, no thyromegaly Respiratory: clear to auscultation bilaterally, no wheezing, no crackles. Normal respiratory effort. No accessory muscle use.  Cardiovascular: Regular rate and rhythm, no murmurs / rubs / gallops. No extremity edema. 2+ pedal pulses. No carotid bruits.  Abdomen: no tenderness, no masses palpated. No hepatosplenomegaly. Bowel sounds positive.  Musculoskeletal: no clubbing / cyanosis. No joint deformity upper and lower extremities. Good ROM, no contractures. Normal muscle tone.  Skin: no rashes, lesions, ulcers. No induration Neurologic: CN 2-12 grossly intact. Sensation intact, DTR normal. Strength 5/5 in all 4.  Psychiatric: Normal judgment and insight. Alert and oriented x 3. Normal mood.     Labs on Admission: I have personally reviewed following labs and imaging studies  CBC: Recent Labs  Lab 11/05/22 1131  WBC 5.7  HGB 12.4*  HCT 37.3*  MCV 87.8  PLT 227   Basic Metabolic Panel: Recent Labs  Lab 11/05/22 1131  NA 136  K 4.0  CL 102  CO2 25  GLUCOSE 128*  BUN 22*  CREATININE 0.90  CALCIUM 9.1   GFR: Estimated Creatinine Clearance: 82.3 mL/min (by C-G formula based on SCr of 0.9 mg/dL). Liver Function Tests: Recent Labs  Lab 11/05/22 1131  AST 27  ALT 22  ALKPHOS 56  BILITOT 0.6  PROT 7.1  ALBUMIN 4.3   No results for input(s): "LIPASE", "AMYLASE" in the last 168 hours. No results for input(s): "AMMONIA" in the last 168 hours. Coagulation Profile: No results for input(s): "INR", "PROTIME" in the last 168 hours. Cardiac Enzymes: No results for input(s): "CKTOTAL", "CKMB", "CKMBINDEX", "TROPONINI" in the last 168 hours. BNP (last 3 results) No results for input(s): "PROBNP" in the last 8760 hours. HbA1C: No results for input(s): "HGBA1C" in the last 72 hours. CBG: No results for input(s): "GLUCAP" in the last 168 hours. Lipid Profile: No results for input(s): "CHOL", "HDL", "LDLCALC", "TRIG", "CHOLHDL",  "LDLDIRECT" in the last 72 hours. Thyroid Function Tests: No results for input(s): "TSH", "T4TOTAL", "FREET4", "T3FREE", "THYROIDAB" in the last 72 hours. Anemia Panel: No results for input(s): "VITAMINB12", "FOLATE", "FERRITIN", "TIBC", "IRON", "RETICCTPCT" in the last 72 hours. Urine analysis:    Component Value Date/Time   COLORURINE YELLOW (A) 08/13/2020 1229   APPEARANCEUR CLEAR (A) 08/13/2020 1229   LABSPEC >1.046 (H) 08/13/2020 1229   PHURINE 6.0 08/13/2020 1229   GLUCOSEU >=500 (A) 08/13/2020 1229   HGBUR NEGATIVE 08/13/2020 1229   BILIRUBINUR NEGATIVE 08/13/2020 1229   KETONESUR 5 (A) 08/13/2020 1229   PROTEINUR NEGATIVE 08/13/2020 1229   NITRITE NEGATIVE 08/13/2020 1229   LEUKOCYTESUR NEGATIVE 08/13/2020 1229    Radiological Exams on Admission: DG Chest Port 1 View  Result Date: 11/05/2022 CLINICAL DATA:  Chest pain and shortness of breath. EXAM: PORTABLE CHEST 1 VIEW COMPARISON:  None Available. FINDINGS: The  cardiomediastinal silhouette is within normal limits. There is slight accentuation of the interstitial markings. Mild scarring is suspected in the right upper lobe. No overt pulmonary edema, segmental airspace consolidation, sizeable pleural effusion, or pneumothorax is identified. No acute osseous abnormality is seen. IMPRESSION: No active disease. Electronically Signed   By: Sebastian Ache M.D.   On: 11/05/2022 12:48    EKG: Independently reviewed.  Sinus rhythm, no acute ST changes.  Assessment/Plan Principal Problem:   Angina at rest Active Problems:   Unstable angina (HCC)  (please populate well all problems here in Problem List. (For example, if patient is on BP meds at home and you resume or decide to hold them, it is a problem that needs to be her. Same for CAD, COPD, HLD and so on)  Unstable angina -Reviewed patient record in Care Everywhere showed patient started to have similar chest discomfort back in July, and was evaluated by his cardiologist at [facility].   Consideration was coronary spasm versus esophageal spasm and patient was started on Imdur.  Patient does report some improvement of chest pains after.  Given the similar time of onset of his chest pain, clinically still suspect this represent breakthrough coronary artery spasm likely.  Plan to continue Imdur. -Continue aspirin Plavix and statin -Cardiology consulted in ED -Continue heparin drip -Add PPI  History of trigeminal neuralgia -Continue gabapentin and Trileptal  DVT prophylaxis: Heparin drip Code Status: Full code Family Communication: None at bedside Disposition Plan: Patient is sick with ACS, requiring inpatient cardiology consult and and heparin drip, expect more than 2 midnight hospital stay Consults called: Cardiology Admission status: Tele admit   Emeline General MD [facility] Pager [phone]  11/05/2022, 4:00 PM

## 2022-11-05 NOTE — ED Notes (Signed)
Delay in getting blood work started due to IV not pulling. Reina RN attempting ultrasound IV. Pt is a hard stick. Heparin also delayed due to needing another IV placed before admin of heparin.

## 2022-11-05 NOTE — ED Notes (Signed)
Pt care taken, complaining of the iv in the bend of his arm hurting, started another iv and removed that one

## 2022-11-06 ENCOUNTER — Encounter: Payer: Self-pay | Admitting: Internal Medicine

## 2022-11-06 DIAGNOSIS — I959 Hypotension, unspecified: Secondary | ICD-10-CM

## 2022-11-06 DIAGNOSIS — I10 Essential (primary) hypertension: Secondary | ICD-10-CM | POA: Diagnosis not present

## 2022-11-06 DIAGNOSIS — I2089 Other forms of angina pectoris: Secondary | ICD-10-CM | POA: Diagnosis not present

## 2022-11-06 LAB — CBC
HCT: 31.7 % — ABNORMAL LOW (ref 39.0–52.0)
Hemoglobin: 10.7 g/dL — ABNORMAL LOW (ref 13.0–17.0)
MCH: 28.9 pg (ref 26.0–34.0)
MCHC: 33.8 g/dL (ref 30.0–36.0)
MCV: 85.7 fL (ref 80.0–100.0)
Platelets: 192 10*3/uL (ref 150–400)
RBC: 3.7 MIL/uL — ABNORMAL LOW (ref 4.22–5.81)
RDW: 14 % (ref 11.5–15.5)
WBC: 5.3 10*3/uL (ref 4.0–10.5)
nRBC: 0 % (ref 0.0–0.2)

## 2022-11-06 LAB — TROPONIN I (HIGH SENSITIVITY)
Troponin I (High Sensitivity): 62 ng/L — ABNORMAL HIGH (ref <18)
Troponin I (High Sensitivity): 44 ng/L — ABNORMAL HIGH (ref <18)
Troponin I (High Sensitivity): 38 ng/L — ABNORMAL HIGH (ref <18)

## 2022-11-06 LAB — HEPARIN LEVEL (UNFRACTIONATED): Heparin Unfractionated: 0.42 [IU]/mL (ref 0.30–0.70)

## 2022-11-06 LAB — D-DIMER, QUANTITATIVE: D-Dimer, Quant: 0.55 ug{FEU}/mL — ABNORMAL HIGH (ref 0.00–0.50)

## 2022-11-06 LAB — LIPOPROTEIN A (LPA): Lipoprotein (a): 88.6 nmol/L — ABNORMAL HIGH (ref <75.0)

## 2022-11-06 LAB — HIV ANTIBODY (ROUTINE TESTING W REFLEX): HIV Screen 4th Generation wRfx: NONREACTIVE

## 2022-11-06 LAB — TSH: TSH: 0.902 u[IU]/mL (ref 0.350–4.500)

## 2022-11-06 MED ORDER — NICOTINE 14 MG/24HR TD PT24
14.0000 mg | MEDICATED_PATCH | Freq: Every day | TRANSDERMAL | Status: DC
  Administered 2022-11-06 – 2022-11-07 (×2): 14 mg via TRANSDERMAL
  Filled 2022-11-06 (×2): qty 1

## 2022-11-06 MED ORDER — ACETAMINOPHEN 500 MG PO TABS: 1000.0000 mg | ORAL_TABLET | Freq: Four times a day (QID) | ORAL | Status: DC | PRN

## 2022-11-06 MED ORDER — SODIUM CHLORIDE 0.9 % IV BOLUS
500.0000 mL | Freq: Once | INTRAVENOUS | Status: AC
  Administered 2022-11-06: 500 mL via INTRAVENOUS

## 2022-11-06 MED ORDER — RANOLAZINE ER 500 MG PO TB12
500.0000 mg | ORAL_TABLET | Freq: Two times a day (BID) | ORAL | Status: DC
  Administered 2022-11-06 – 2022-11-07 (×2): 500 mg via ORAL
  Filled 2022-11-06 (×3): qty 1

## 2022-11-06 MED ORDER — BUPRENORPHINE HCL-NALOXONE HCL 8-2 MG SL SUBL
1.0000 | SUBLINGUAL_TABLET | Freq: Two times a day (BID) | SUBLINGUAL | Status: DC
  Administered 2022-11-06 – 2022-11-07 (×3): 1 via SUBLINGUAL
  Filled 2022-11-06 (×3): qty 1

## 2022-11-06 MED ORDER — RANOLAZINE ER 500 MG PO TB12: 500.0000 mg | ORAL_TABLET | Freq: Two times a day (BID) | ORAL | Status: DC

## 2022-11-06 MED ORDER — INFLUENZA VIRUS VACC SPLIT PF (FLUZONE) 0.5 ML IM SUSY
0.5000 mL | PREFILLED_SYRINGE | INTRAMUSCULAR | Status: DC
  Filled 2022-11-06: qty 0.5

## 2022-11-06 MED ORDER — IBUPROFEN 400 MG PO TABS
200.0000 mg | ORAL_TABLET | Freq: Once | ORAL | Status: AC
  Administered 2022-11-06: 200 mg via ORAL
  Filled 2022-11-06: qty 1

## 2022-11-06 MED ORDER — ACETAMINOPHEN 500 MG PO TABS: 1000.0000 mg | ORAL_TABLET | ORAL | Status: DC | PRN

## 2022-11-06 NOTE — Hospital Course (Addendum)
Christopher Beck is a 57 y.o. male with medical history significant of CAD, STEMI s/p RCA stenting and staged LAD stenting, HTN, chronic trigeminal neuralgia, presented with new onset of chest pains.  Patient had a peak troponin of 62, came down to 38.  D-dimer 0.55, normal for patient age. Patient is placed on heparin drip. Patient is seen by cardiology, thought this is secondary to coronary spasm, no indication for repeated coronary angiogram.  Medication adjusted, patient will be finishing up 48 hours of heparin drip, medically stable for discharge.

## 2022-11-06 NOTE — Progress Notes (Signed)
       CROSS COVER NOTE  NAME: Christopher Beck MRN: [id number] DOB : Apr 23, 1965    Concern as stated by nurse / staff   Good evening. Patient admitted yesterday for NSTEMI. He is on a Heparin gtt and currently denies any chest pain. This patient is complaining of a 5/10 frontal headache. He last had Nitro yesterday afternoon. BP is slightly elevated at 145/88. Per patient, this has been ongoing all day. He recently had tylenol which has not helped and cannot receive again yet. Can we get something else onboard.      Pertinent findings on chart review: Labs reviewed: creat 0.99; hepatic function normal NKA  Assessment and  Interventions   Assessment:    11/06/2022    8:34 PM 11/06/2022    8:18 PM 11/06/2022    5:40 PM  Vitals with BMI  Systolic 121 145 409  Diastolic 80 88 95  Pulse 56      Plan: Dose of ibuprofen ordered Increase prn acetaminophen dose to 1000 mg prn every 6h    Donnie Mesa NP [facility] Cross Cover 7pm-7am - check amion for availability Pager [phone]

## 2022-11-06 NOTE — Consult Note (Signed)
[facility] CLINIC CARDIOLOGY CONSULT NOTE       Patient ID: Christopher Beck MRN: [id number] DOB/AGE: [birth year] 57 y.o.  Admit date: 11/05/2022 Referring Physician Dr. Marrion Coy Primary Physician Dr. Laurine Blazer Primary Cardiologist Dr. Arrie Senate Reason for Consultation chest pain  HPI: Christopher Beck is a 57 y.o. male  with a past medical history of CAD with STEMI (s/p PCI x3 to mid and distal RCA and LAD 12/2019) c/b VT/VF arrest, NSTEMI (04/2021 s/p DES to RCA), HTN, HLD, tobacco use who presented to the ED on 11/05/2022 for chest pain. Cardiology was consulted for further evaluation.   Patient presents to the ED for evaluation of chest pain that began late yesterday evening.  States that over the last week he has had 2 episodes of chest pain but the episode yesterday was more persistent and thus prompting him to come to the ED for evaluation.  Reports that he took aspirin and 3 doses of nitroglycerin at home none of which improved his pain.  He states that the pain is located in the center of his chest and radiates across his chest and down both of his arms.  Also endorses feeling "clammy and has numb thumbs".  States that the pain feels similar to which she is experienced in the past.  Denies any associated shortness of breath or palpitations.  Reports that he recently followed up with his cardiologist who started him on Imdur for presumed coronary vasospasm.  Workup in the ED notable for troponin trending 4 > 15 > 62 > 44 > 38.  EKG nonacute, stable from prior.  Labs notable for creatinine 0.9, potassium 4.0, hemoglobin 12.4, D-dimer 0.55.  Primary team is planning to complete CT chest to rule out PE per the patient.  At the time of my evaluation this morning he is resting comfortably in hospital bed.  States that overall his chest pain is improved, states that he "could feel it coming on" but he ate breakfast and his symptoms resolved this morning.  His blood pressure has been low  normal, he states that this is usually well-controlled at home.  He denies any dizziness or lightheadedness.  Overall feels better than yesterday but is worried that he will have another episode.  Review of systems complete and found to be negative unless listed above    Past Medical History:  Diagnosis Date   Hypertension    Lumbar stress fracture     Past Surgical History:  Procedure Laterality Date   FRACTURE SURGERY      (Not in a hospital admission)  Social History   Socioeconomic History   Marital status: Widowed    Spouse name: Not on file   Number of children: Not on file   Years of education: Not on file   Highest education level: Not on file  Occupational History   Not on file  Tobacco Use   Smoking status: Former    Current packs/day: 0.00    Types: Cigarettes    Quit date: 04/01/2018    Years since quitting: 4.6   Smokeless tobacco: Never  Vaping Use   Vaping status: Some Days  Substance and Sexual Activity   Alcohol use: No   Drug use: Not Currently   Sexual activity: Not on file  Other Topics Concern   Not on file  Social History Narrative   Not on file   Social Determinants of Health   Financial Resource Strain: Low Risk  (03/21/2022)   Received  from [facility]   Overall Financial Resource Strain (CARDIA)    Difficulty of Paying Living Expenses: Not hard at all  Food Insecurity: No Food Insecurity (11/06/2022)   Hunger Vital Sign    Worried About Running Out of Food in the Last Year: Never true    Ran Out of Food in the Last Year: Never true  Transportation Needs: No Transportation Needs (11/06/2022)   PRAPARE - Administrator, Civil Service (Medical): No    Lack of Transportation (Non-Medical): No  Physical Activity: Sufficiently Active (10/12/2018)   Received from [facility]   Exercise Vital Sign    Days of Exercise per Week: 5 days    Minutes of Exercise per Session: 60 min  Stress: Stress  Concern Present (10/12/2018)   Received from [facility] - Occupational Stress Questionnaire    Feeling of Stress : Rather much  Social Connections: Not on file  Intimate Partner Violence: Not At Risk (11/06/2022)   Humiliation, Afraid, Rape, and Kick questionnaire    Fear of Current or Ex-Partner: No    Emotionally Abused: No    Physically Abused: No    Sexually Abused: No    Family History  Problem Relation Age of Onset   Healthy Mother    Healthy Father      Vitals:   11/06/22 1203 11/06/22 1204 11/06/22 1300 11/06/22 1340  BP:  96/80 95/77   Pulse:  (!) 58 (!) 56 (!) 59  Resp:  18 16 18   Temp: 97.9 F (36.6 C)     TempSrc: Oral     SpO2:  99% 98% 99%  Weight:      Height:        PHYSICAL EXAM General: Well-appearing male, well nourished, in no acute distress sitting upright in ED stretcher. HEENT: Normocephalic and atraumatic. Neck: No JVD.  Lungs: Normal respiratory effort on room air. Clear bilaterally to auscultation. No wheezes, crackles, rhonchi.  Heart: HRRR. Normal S1 and S2 without gallops or murmurs.  Abdomen: Non-distended appearing.  Msk: Normal strength and tone for age. Extremities: Warm and well perfused. No clubbing, cyanosis. No edema.  Neuro: Alert and oriented X 3. Psych: Answers questions appropriately.   Labs: Basic Metabolic Panel: Recent Labs    11/05/22 1131  NA 136  K 4.0  CL 102  CO2 25  GLUCOSE 128*  BUN 22*  CREATININE 0.90  CALCIUM 9.1   Liver Function Tests: Recent Labs    11/05/22 1131  AST 27  ALT 22  ALKPHOS 56  BILITOT 0.6  PROT 7.1  ALBUMIN 4.3   No results for input(s): "LIPASE", "AMYLASE" in the last 72 hours. CBC: Recent Labs    11/05/22 1131 11/06/22 0424  WBC 5.7 5.3  HGB 12.4* 10.7*  HCT 37.3* 31.7*  MCV 87.8 85.7  PLT 227 192   Cardiac Enzymes: Recent Labs    11/06/22 0424 11/06/22 0817 11/06/22 1058  TROPONINIHS 62* 44* 38*    BNP: No results for input(s): "BNP" in the last 72 hours. D-Dimer: Recent Labs    11/06/22 0817  DDIMER 0.55*   Hemoglobin A1C: No results for input(s): "HGBA1C" in the last 72 hours. Fasting Lipid Panel: No results for input(s): "CHOL", "HDL", "LDLCALC", "TRIG", "CHOLHDL", "LDLDIRECT" in the last 72 hours. Thyroid Function Tests: Recent Labs    11/05/22 1332  TSH 0.902   Anemia Panel: No results for input(s): "VITAMINB12", "  FOLATE", "FERRITIN", "TIBC", "IRON", "RETICCTPCT" in the last 72 hours.   Radiology: [facility] Chest Port 1 View  Result Date: 11/05/2022 CLINICAL DATA:  Chest pain and shortness of breath. EXAM: PORTABLE CHEST 1 VIEW COMPARISON:  None Available. FINDINGS: The cardiomediastinal silhouette is within normal limits. There is slight accentuation of the interstitial markings. Mild scarring is suspected in the right upper lobe. No overt pulmonary edema, segmental airspace consolidation, sizeable pleural effusion, or pneumothorax is identified. No acute osseous abnormality is seen. IMPRESSION: No active disease. Electronically Signed   By: Sebastian Ache M.D.   On: 11/05/2022 12:48    ECHO 03/2022:  MILD LV DYSFUNCTION EF 45-50%   NORMAL RIGHT VENTRICULAR SYSTOLIC FUNCTION    VALVULAR REGURGITATION: MILD AR, TRIVIAL MR, TRIVIAL PR, TRIVIAL TR    NO VALVULAR STENOSIS    DILATED AO SINUS (4.2cm)    BRADYCARDIC THROUGHOUT THE EXAM   TELEMETRY reviewed by me [facility]) 11/06/2022: sinus rhythm 50-60s  EKG reviewed by me: sinus bradycardia rate 54 bpm, nonacute  Data reviewed by me [facility]) 11/06/2022: last 24h vitals tele labs imaging I/O ED provider note, admission H&P  Principal Problem:   Angina at rest Active Problems:   CAD (coronary artery disease)   Unstable angina (HCC)   Essential hypertension   Hypotension    ASSESSMENT AND PLAN:  Christopher Beck is a 57 y.o. male  with a past medical history of CAD with STEMI (s/p PCI x3 to mid and distal RCA and LAD 12/2019) c/b  VT/VF arrest, NSTEMI (04/2021 s/p DES to RCA), HTN, HLD, tobacco use who presented to the ED on 11/05/2022 for chest pain. Cardiology was consulted for further evaluation.   # Unstable angina # Coronary artery disease s/p DES to LAD (2021) and RCA (2023) Patient with history of CAD followed by [facility] cardiology presenting with episode of more persistent chest pain. Did not resolve with nitroglycerin at home. Recently saw cardiology who started imdur for coronary vasospasm. Troponins 4 > 15 > 62 > 44 > 38. EKG nonacute, stable from prior.  -Start ranexa 500 mg bid. Continue home imdur if BP allows.  -No plan for further cardiac diagnostics.   # Hypotension Patient received fluid bolus this AM in the ED with mild improvement in BP. He is without dizziness, lightheadedness.  -Continue to monitor BP closely. Hold parameters added for BP medications.   # Bradycardia Patient with HR in the 50s in the ED. Denies dizziness, lightheadedness.  -Metoprolol discontinued by primary. Consider restarting on discharge for anginal management.    This patient's plan of care was discussed and created with Dr. Juliann Pares and he is in agreement.  Signed: Gale Journey, PA-C  11/06/2022, 2:27 PM [facility]

## 2022-11-06 NOTE — Consult Note (Signed)
ANTICOAGULATION CONSULT NOTE  Pharmacy Consult for IV Heparin Indication:  "VTE treatment" - per triage notes here with chest pain - clarified as ACS treatment with EDP  Patient Measurements: Height: 5\' 7"  (170.2 cm) Weight: 63.5 kg (140 lb) IBW/kg (Calculated) : 66.1 Heparin Dosing Weight: 63.5 kg  Labs: Recent Labs    11/05/22 1131 11/05/22 1332 11/05/22 1548 11/05/22 2234 11/06/22 0424  HGB 12.4*  --   --   --  10.7*  HCT 37.3*  --   --   --  31.7*  PLT 227  --   --   --  192  APTT  --   --  27  --   --   LABPROT  --   --  13.2  --   --   INR  --   --  1.0  --   --   HEPARINUNFRC  --   --   --  0.44 0.42  CREATININE 0.90  --   --   --   --   TROPONINIHS 4 15  --   --   --     Estimated Creatinine Clearance: 82.3 mL/min (by C-G formula based on SCr of 0.9 mg/dL).   Medical History: Past Medical History:  Diagnosis Date   Hypertension    Lumbar stress fracture     Medications:  No anticoagulation prior to admission per my chart review. Medication reconciliation is pending.   Assessment: 57 y/o M presenting to the ED 9/4 with chest pain. Has history of MI s/p multiple stents per triage note. Pharmacy consulted to initiate and manage heparin infusion.  Baseline H&H overall within normal limits. Baseline aPTT and PT-INR are pending.  Goal of Therapy:  Heparin level 0.3-0.7 units/ml Monitor platelets by anticoagulation protocol: Yes   09/04 2234 HL 0.44, therapeutic x 1 09/05 0424 HL 0.42, therapeutic x 2  Plan:  --Continue heparin infusion at 750 units/hr --Recheck HL daily w/ AM labs while therapeutic --Daily CBC per protocol while on IV heparin  Otelia Sergeant, PharmD, [facility] 11/06/2022 4:53 AM

## 2022-11-06 NOTE — Progress Notes (Signed)
  Progress Note   Patient: Christopher Beck YQM:[id number] DOB: [birth year] DOA: 11/05/2022     1 DOS: the patient was seen and examined on 11/06/2022   Brief hospital course: ARMOR BAUM is a 57 y.o. male with medical history significant of CAD, STEMI s/p RCA stenting and staged LAD stenting, HTN, chronic trigeminal neuralgia, presented with new onset of chest pains.  Patient had a peak troponin of 62, came down to 38.  D-dimer 0.55, normal for patient age. Cardiology consult obtained.  Patient is placed on heparin drip. Patient had transient hypotension this morning, received a fluid bolus.  Principal Problem:   Angina at rest Active Problems:   Unstable angina (HCC)   Assessment and Plan: Unstable angina versus non-STEMI. Patient has elevated troponin with chest pain.  D-dimer normal for age, patient does not have PE. Patient is placed on aspirin, Plavix and statin.  Patient is also put on heparin drip. Patient will be seen by cardiology.  Workup per cardiology.  Essential hypertension Transient hypotension. Patient had a hypotension earlier this morning, received a fluid bolus.  I will discontinue beta-blocker at this time.       Subjective:  Patient feels much better today, no chest pain.  Physical Exam: Vitals:   11/06/22 0915 11/06/22 1203 11/06/22 1204 11/06/22 1300  BP: 98/65  96/80 95/77  Pulse: (!) 50  (!) 58 (!) 56  Resp:   18 16  Temp:  97.9 F (36.6 C)    TempSrc:  Oral    SpO2:   99% 98%  Weight:      Height:       General exam: Appears calm and comfortable  Respiratory system: Clear to auscultation. Respiratory effort normal. Cardiovascular system: S1 & S2 heard, RRR. No JVD, murmurs, rubs, gallops or clicks. No pedal edema. Gastrointestinal system: Abdomen is nondistended, soft and nontender. No organomegaly or masses felt. Normal bowel sounds heard. Central nervous system: Alert and oriented. No focal neurological deficits. Extremities: Symmetric 5  x 5 power. Skin: No rashes, lesions or ulcers Psychiatry: Judgement and insight appear normal. Mood & affect appropriate.    Data Reviewed:  Lab results reviewed.  Family Communication: None  Disposition: Status is: Inpatient Remains inpatient appropriate because: Severity of disease, potential inpatient procedure.     Time spent: 35 minutes  Author: Marrion Coy, MD 11/06/2022 1:14 PM  For on call review [email or web address].

## 2022-11-06 NOTE — ED Notes (Signed)
MD messaged regarding pt requesting suboxone and nicotine patch

## 2022-11-07 ENCOUNTER — Other Ambulatory Visit: Payer: Self-pay

## 2022-11-07 ENCOUNTER — Emergency Department: Payer: Medicaid Other

## 2022-11-07 ENCOUNTER — Emergency Department
Admission: EM | Admit: 2022-11-07 | Discharge: 2022-11-07 | Disposition: A | Discharge status: Home / Self Care | Payer: Medicaid Other | Attending: Emergency Medicine | Admitting: Emergency Medicine

## 2022-11-07 DIAGNOSIS — I2089 Other forms of angina pectoris: Secondary | ICD-10-CM

## 2022-11-07 DIAGNOSIS — I10 Essential (primary) hypertension: Secondary | ICD-10-CM | POA: Insufficient documentation

## 2022-11-07 DIAGNOSIS — I200 Unstable angina: Secondary | ICD-10-CM | POA: Diagnosis not present

## 2022-11-07 DIAGNOSIS — I959 Hypotension, unspecified: Secondary | ICD-10-CM | POA: Diagnosis not present

## 2022-11-07 DIAGNOSIS — I2510 Atherosclerotic heart disease of native coronary artery without angina pectoris: Secondary | ICD-10-CM | POA: Diagnosis not present

## 2022-11-07 DIAGNOSIS — R079 Chest pain, unspecified: Secondary | ICD-10-CM | POA: Diagnosis present

## 2022-11-07 LAB — CBC
HCT: 31 % — ABNORMAL LOW (ref 39.0–52.0)
HCT: 36.2 % — ABNORMAL LOW (ref 39.0–52.0)
Hemoglobin: 10.6 g/dL — ABNORMAL LOW (ref 13.0–17.0)
Hemoglobin: 12.1 g/dL — ABNORMAL LOW (ref 13.0–17.0)
MCH: 29.2 pg (ref 26.0–34.0)
MCH: 28.9 pg (ref 26.0–34.0)
MCHC: 34.2 g/dL (ref 30.0–36.0)
MCHC: 33.4 g/dL (ref 30.0–36.0)
MCV: 85.4 fL (ref 80.0–100.0)
MCV: 86.6 fL (ref 80.0–100.0)
Platelets: 179 10*3/uL (ref 150–400)
Platelets: 206 10*3/uL (ref 150–400)
RBC: 3.63 MIL/uL — ABNORMAL LOW (ref 4.22–5.81)
RBC: 4.18 MIL/uL — ABNORMAL LOW (ref 4.22–5.81)
RDW: 13.7 % (ref 11.5–15.5)
RDW: 13.6 % (ref 11.5–15.5)
WBC: 4.5 10*3/uL (ref 4.0–10.5)
WBC: 4.5 10*3/uL (ref 4.0–10.5)
nRBC: 0 % (ref 0.0–0.2)
nRBC: 0 % (ref 0.0–0.2)

## 2022-11-07 LAB — BASIC METABOLIC PANEL
Anion gap: 17 — ABNORMAL HIGH (ref 5–15)
BUN: 15 mg/dL (ref 6–20)
CO2: 23 mmol/L (ref 22–32)
Calcium: 8.7 mg/dL — ABNORMAL LOW (ref 8.9–10.3)
Chloride: 99 mmol/L (ref 98–111)
Creatinine, Ser: 0.98 mg/dL (ref 0.61–1.24)
GFR, Estimated: >60 mL/min (ref >60)
Glucose, Bld: 104 mg/dL — ABNORMAL HIGH (ref 70–99)
Potassium: 3.6 mmol/L (ref 3.5–5.1)
Sodium: 139 mmol/L (ref 135–145)

## 2022-11-07 LAB — TROPONIN I (HIGH SENSITIVITY): Troponin I (High Sensitivity): 15 ng/L (ref <18)

## 2022-11-07 LAB — HEPARIN LEVEL (UNFRACTIONATED): Heparin Unfractionated: 0.38 [IU]/mL (ref 0.30–0.70)

## 2022-11-07 MED ORDER — RANOLAZINE ER 500 MG PO TB12: 500.0000 mg | ORAL_TABLET | Freq: Two times a day (BID) | ORAL | 0 refills | Status: AC

## 2022-11-07 MED ORDER — MORPHINE SULFATE (PF) 4 MG/ML IV SOLN
4.0000 mg | Freq: Once | INTRAVENOUS | Status: AC
  Administered 2022-11-07: 4 mg via INTRAMUSCULAR
  Filled 2022-11-07: qty 1

## 2022-11-07 MED ORDER — HYDROCODONE-ACETAMINOPHEN 5-325 MG PO TABS: 1.0000 | ORAL_TABLET | Freq: Once | ORAL | Status: DC

## 2022-11-07 MED ORDER — PANTOPRAZOLE SODIUM 40 MG PO TBEC: 40.0000 mg | DELAYED_RELEASE_TABLET | Freq: Every day | ORAL | 0 refills | Status: AC

## 2022-11-07 NOTE — Consult Note (Signed)
ANTICOAGULATION CONSULT NOTE  Pharmacy Consult for IV Heparin Indication:  "VTE treatment" - per triage notes here with chest pain - clarified as ACS treatment with EDP  Patient Measurements: Height: 5\' 7"  (170.2 cm) Weight: 63.5 kg (140 lb) IBW/kg (Calculated) : 66.1 Heparin Dosing Weight: 63.5 kg  Labs: Recent Labs    11/05/22 1131 11/05/22 1332 11/05/22 1548 11/05/22 2234 11/06/22 0424 11/06/22 0817 11/06/22 1058 11/07/22 0453  HGB 12.4*  --   --   --  10.7*  --   --  10.6*  HCT 37.3*  --   --   --  31.7*  --   --  31.0*  PLT 227  --   --   --  192  --   --  179  APTT  --   --  27  --   --   --   --   --   LABPROT  --   --  13.2  --   --   --   --   --   INR  --   --  1.0  --   --   --   --   --   HEPARINUNFRC  --   --   --  0.44 0.42  --   --  0.38  CREATININE 0.90  --   --   --   --   --   --   --   TROPONINIHS 4   < >  --   --  62* 44* 38*  --    < > = values in this interval not displayed.    Estimated Creatinine Clearance: 82.3 mL/min (by C-G formula based on SCr of 0.9 mg/dL).   Medical History: Past Medical History:  Diagnosis Date   Hypertension    Lumbar stress fracture     Medications:  No anticoagulation prior to admission per my chart review. Medication reconciliation is pending.   Assessment: 57 y/o M presenting to the ED 9/4 with chest pain. Has history of MI s/p multiple stents per triage note. Pharmacy consulted to initiate and manage heparin infusion.  Baseline H&H overall within normal limits. Baseline aPTT and PT-INR are pending.  Goal of Therapy:  Heparin level 0.3-0.7 units/ml Monitor platelets by anticoagulation protocol: Yes   09/04 2234 HL 0.44, therapeutic x 1 09/05 0424 HL 0.42, therapeutic x 2 09/06 0453 HL 0.38, therapeutic x 3  Plan:  --Continue heparin infusion at 750 units/hr --Recheck HL daily w/ AM labs while therapeutic --Daily CBC per protocol while on IV heparin  Otelia Sergeant, PharmD, [facility] 11/07/2022 5:40 AM

## 2022-11-07 NOTE — Discharge Instructions (Signed)
Please make sure you pick up your prescriptions and take them as directed.  Please follow-up with cardiology on Monday morning as directed.  Return to the ER for new or worsening symptoms.

## 2022-11-07 NOTE — Discharge Summary (Signed)
Physician Discharge Summary   Patient: Christopher Beck MRN: [id number] DOB: 11-10-1965  Admit date:     11/05/2022  Discharge date: 11/07/22  Discharge Physician: Marrion Coy   PCP: Laurine Blazer, MD   Recommendations at discharge:   Follow-up with PCP in 1 week. Follow-up with cardiology in 1 week.  Discharge Diagnoses: Principal Problem:   Angina at rest Active Problems:   CAD (coronary artery disease)   Unstable angina (HCC)   Essential hypertension   Hypotension  Resolved Problems:   * No resolved hospital problems. *  Hospital Course: Christopher Beck is a 57 y.o. male with medical history significant of CAD, STEMI s/p RCA stenting and staged LAD stenting, HTN, chronic trigeminal neuralgia, presented with new onset of chest pains.  Patient had a peak troponin of 62, came down to 38.  D-dimer 0.55, normal for patient age. Patient is placed on heparin drip. Patient is seen by cardiology, thought this is secondary to coronary spasm, no indication for repeated coronary angiogram.  Medication adjusted, patient will be finishing up 48 hours of heparin drip, medically stable for discharge.  Assessment and Plan: Unstable angina Patient has elevated troponin with chest pain.  D-dimer normal for age, patient does not have PE. Patient is placed on aspirin, Plavix and statin.  Patient is also put on heparin drip, will be complete 48 hours. Patient has been seen by cardiology, medication will be adjusted before discharge. Currently, symptom has resolved, medically stable for discharge.   Essential hypertension Transient hypotension. Blood pressure better this morning, medication adjusted. Due to relatively low blood pressure, lisinopril was on hold.  Beta-blocker is also on hold. Amlodipine was restarted the due to concern of coronary spasm.         Consultants: Cardiology Procedures performed: None  Disposition: Home Diet recommendation:  Discharge Diet Orders (From  admission, onward)     Start     Ordered   11/07/22 0000  Diet - low sodium heart healthy        11/07/22 1045           Cardiac diet DISCHARGE MEDICATION: Allergies as of 11/07/2022   No Known Allergies      Medication List     STOP taking these medications    empagliflozin 10 MG Tabs tablet Commonly known as: JARDIANCE   esomeprazole 40 MG capsule Commonly known as: NEXIUM   lisinopril 5 MG tablet Commonly known as: ZESTRIL   metoprolol succinate 25 MG 24 hr tablet Commonly known as: TOPROL-XL   OXcarbazepine 150 MG tablet Commonly known as: TRILEPTAL       TAKE these medications    amLODipine 10 MG tablet Commonly known as: NORVASC Take 10 mg by mouth daily.   aspirin 81 MG chewable tablet Chew 81 mg by mouth daily.   clopidogrel 75 MG tablet Commonly known as: PLAVIX Take 75 mg by mouth daily.   ezetimibe 10 MG tablet Commonly known as: ZETIA Take 1 tablet by mouth daily.   gabapentin 600 MG tablet Commonly known as: NEURONTIN Take 600 mg by mouth 3 (three) times daily.   isosorbide mononitrate 30 MG 24 hr tablet Commonly known as: IMDUR Take 30 mg by mouth daily.   nicotine 14 mg/24hr patch Commonly known as: NICODERM CQ - dosed in mg/24 hours Place 14 mg onto the skin daily.   nitroGLYCERIN 0.4 MG SL tablet Commonly known as: NITROSTAT Place 0.4 mg under the tongue every 5 (five) minutes as needed for  chest pain.   pantoprazole 40 MG tablet Commonly known as: PROTONIX Take 1 tablet (40 mg total) by mouth daily. Start taking on: November 08, 2022   ranolazine 500 MG 12 hr tablet Commonly known as: RANEXA Take 1 tablet (500 mg total) by mouth 2 (two) times daily.   rosuvastatin 40 MG tablet Commonly known as: CRESTOR Take 40 mg by mouth daily.   Suboxone 8-2 MG Film Generic drug: Buprenorphine HCl-Naloxone HCl Place 1 Film under the tongue 2 (two) times daily.        Follow-up Information     Arrie Senate, MD. Go  in 1 week(s).   Specialty: Cardiology Contact information: [address] [place] [address] [phone]         Laurine Blazer, MD Follow up in 1 week(s).   Specialty: Family Medicine Contact information: [address] [facility] [address] [place] [address] [phone]                Discharge Exam: Ceasar Mons Weights   11/05/22 1125  Weight: 63.5 kg   General exam: Appears calm and comfortable  Respiratory system: Clear to auscultation. Respiratory effort normal. Cardiovascular system: S1 & S2 heard, RRR. No JVD, murmurs, rubs, gallops or clicks. No pedal edema. Gastrointestinal system: Abdomen is nondistended, soft and nontender. No organomegaly or masses felt. Normal bowel sounds heard. Central nervous system: Alert and oriented. No focal neurological deficits. Extremities: Symmetric 5 x 5 power. Skin: No rashes, lesions or ulcers Psychiatry: Judgement and insight appear normal. Mood & affect appropriate.    Condition at discharge: good  The results of significant diagnostics from this hospitalization (including imaging, microbiology, ancillary and laboratory) are listed below for reference.   Imaging Studies: DG Chest Port 1 View  Result Date: 11/05/2022 CLINICAL DATA:  Chest pain and shortness of breath. EXAM: PORTABLE CHEST 1 VIEW COMPARISON:  None Available. FINDINGS: The cardiomediastinal silhouette is within normal limits. There is slight accentuation of the interstitial markings. Mild scarring is suspected in the right upper lobe. No overt pulmonary edema, segmental airspace consolidation, sizeable pleural effusion, or pneumothorax is identified. No acute osseous abnormality is seen. IMPRESSION: No active disease. Electronically Signed   By: Sebastian Ache M.D.   On: 11/05/2022 12:48    Microbiology: Results for orders placed or performed during the hospital encounter of 08/13/20  Resp Panel by RT-PCR (Flu A&B, Covid) Nasopharyngeal Swab     Status:  None   Collection Time: 08/13/20  2:02 PM   Specimen: Nasopharyngeal Swab; Nasopharyngeal(NP) swabs in vial transport medium  Result Value Ref Range Status   SARS Coronavirus 2 by RT PCR NEGATIVE NEGATIVE Final    Comment: (NOTE) SARS-CoV-2 target nucleic acids are NOT DETECTED.  The SARS-CoV-2 RNA is generally detectable in upper respiratory specimens during the acute phase of infection. The lowest concentration of SARS-CoV-2 viral copies this assay can detect is 138 copies/mL. A negative result does not preclude SARS-Cov-2 infection and should not be used as the sole basis for treatment or other patient management decisions. A negative result may occur with  improper specimen collection/handling, submission of specimen other than nasopharyngeal swab, presence of viral mutation(s) within the areas targeted by this assay, and inadequate number of viral copies(<138 copies/mL). A negative result must be combined with clinical observations, patient history, and epidemiological information. The expected result is Negative.  Fact Sheet for Patients:  BloggerCourse.com  Fact Sheet for Healthcare Providers:  SeriousBroker.it  This test is no t yet approved  or cleared by the [place] and  has been authorized for detection and/or diagnosis of SARS-CoV-2 by FDA under an Emergency Use Authorization (EUA). This EUA will remain  in effect (meaning this test can be used) for the duration of the COVID-19 declaration under Section 564(b)(1) of the Act, 21 U.S.C.section 360bbb-3(b)(1), unless the authorization is terminated  or revoked sooner.       Influenza A by PCR NEGATIVE NEGATIVE Final   Influenza B by PCR NEGATIVE NEGATIVE Final    Comment: (NOTE) The Xpert Xpress SARS-CoV-2/FLU/RSV plus assay is intended as an aid in the diagnosis of influenza from Nasopharyngeal swab specimens and should not be used as a sole basis for  treatment. Nasal washings and aspirates are unacceptable for Xpert Xpress SARS-CoV-2/FLU/RSV testing.  Fact Sheet for Patients: BloggerCourse.com  Fact Sheet for Healthcare Providers: SeriousBroker.it  This test is not yet approved or cleared by the [place] FDA and has been authorized for detection and/or diagnosis of SARS-CoV-2 by FDA under an Emergency Use Authorization (EUA). This EUA will remain in effect (meaning this test can be used) for the duration of the COVID-19 declaration under Section 564(b)(1) of the Act, 21 U.S.C. section 360bbb-3(b)(1), unless the authorization is terminated or revoked.  Performed at [facility], [address] [place], [place] [address]   MRSA PCR Screening     Status: None   Collection Time: 08/13/20  3:40 PM   Specimen: Nasal Mucosa; Nasopharyngeal  Result Value Ref Range Status   MRSA by PCR NEGATIVE NEGATIVE Final    Comment:        The GeneXpert MRSA Assay (FDA approved for NASAL specimens only), is one component of a comprehensive MRSA colonization surveillance program. It is not intended to diagnose MRSA infection nor to guide or monitor treatment for MRSA infections. Performed at [facility], [address] [place], [place] [address]     Labs: CBC: Recent Labs  Lab 11/05/22 1131 11/06/22 0424 11/07/22 0453  WBC 5.7 5.3 4.5  HGB 12.4* 10.7* 10.6*  HCT 37.3* 31.7* 31.0*  MCV 87.8 85.7 85.4  PLT 227 192 179   Basic Metabolic Panel: Recent Labs  Lab 11/05/22 1131  NA 136  K 4.0  CL 102  CO2 25  GLUCOSE 128*  BUN 22*  CREATININE 0.90  CALCIUM 9.1   Liver Function Tests: Recent Labs  Lab 11/05/22 1131  AST 27  ALT 22  ALKPHOS 56  BILITOT 0.6  PROT 7.1  ALBUMIN 4.3   CBG: No results for input(s): "GLUCAP" in the last 168 hours.  Discharge time spent: greater than 30 minutes.  Signed: Marrion Coy, MD [facility] 11/07/2022

## 2022-11-07 NOTE — Progress Notes (Signed)
[facility] CLINIC CARDIOLOGY CONSULT NOTE       Patient ID: Christopher Beck MRN: [id number] DOB/AGE: Dec 11, 1965 57 y.o.  Admit date: 11/05/2022 Referring Physician Dr. Marrion Coy Primary Physician Dr. Laurine Blazer Primary Cardiologist Dr. Arrie Senate Reason for Consultation chest pain  HPI: Christopher Beck is a 56 y.o. male  with a past medical history of CAD with STEMI (s/p PCI x3 to mid and distal RCA and LAD 12/2019) c/b VT/VF arrest, NSTEMI (04/2021 s/p DES to RCA), HTN, HLD, tobacco use who presented to the ED on 11/05/2022 for chest pain. Cardiology was consulted for further evaluation.   Interval history:  -Patient feeling well this AM, denies any recurrence of chest pain. Eager to go home. -BP improved today, patient tolerating medications well.  -HR improved this AM but still bradycardic overnight.   Review of systems complete and found to be negative unless listed above    Past Medical History:  Diagnosis Date   Hypertension    Lumbar stress fracture     Past Surgical History:  Procedure Laterality Date   FRACTURE SURGERY      Medications Prior to Admission  Medication Sig Dispense Refill Last Dose   amLODipine (NORVASC) 10 MG tablet Take 10 mg by mouth daily.   11/05/2022   aspirin 81 MG chewable tablet Chew 81 mg by mouth daily.   11/05/2022   clopidogrel (PLAVIX) 75 MG tablet Take 75 mg by mouth daily.   11/05/2022 at 1000   esomeprazole (NEXIUM) 40 MG capsule Take 40 mg by mouth daily.   11/05/2022   ezetimibe (ZETIA) 10 MG tablet Take 1 tablet by mouth daily.   11/05/2022   gabapentin (NEURONTIN) 600 MG tablet Take 600 mg by mouth 3 (three) times daily.   11/05/2022   isosorbide mononitrate (IMDUR) 30 MG 24 hr tablet Take 30 mg by mouth daily.   11/05/2022   lisinopril (ZESTRIL) 5 MG tablet Take 5 mg by mouth daily.   11/04/2022 at pm   metoprolol succinate (TOPROL-XL) 25 MG 24 hr tablet Take 25 mg by mouth daily.   11/05/2022   nicotine (NICODERM CQ - DOSED IN MG/24 HOURS)  14 mg/24hr patch Place 14 mg onto the skin daily.   11/05/2022   nitroGLYCERIN (NITROSTAT) 0.4 MG SL tablet Place 0.4 mg under the tongue every 5 (five) minutes as needed for chest pain.   prn at unk   rosuvastatin (CRESTOR) 40 MG tablet Take 40 mg by mouth daily.   11/04/2022 at pm   SUBOXONE 8-2 MG FILM Place 1 Film under the tongue 2 (two) times daily.   11/05/2022 at 1200   empagliflozin (JARDIANCE) 10 MG TABS tablet Take 10 mg by mouth daily. (Patient not taking: Reported on 11/05/2022)   Not Taking   OXcarbazepine (TRILEPTAL) 150 MG tablet Take 300 mg by mouth 3 (three) times daily. (Patient not taking: Reported on 11/05/2022)   Not Taking   Social History   Socioeconomic History   Marital status: Widowed    Spouse name: Not on file   Number of children: Not on file   Years of education: Not on file   Highest education level: Not on file  Occupational History   Not on file  Tobacco Use   Smoking status: Former    Current packs/day: 0.00    Types: Cigarettes    Quit date: 04/01/2018    Years since quitting: 4.6   Smokeless tobacco: Never  Vaping Use   Vaping status:  Some Days  Substance and Sexual Activity   Alcohol use: No   Drug use: Not Currently   Sexual activity: Not on file  Other Topics Concern   Not on file  Social History Narrative   Not on file   Social Determinants of Health   Financial Resource Strain: Low Risk  (03/21/2022)   Received from [facility]   Overall Financial Resource Strain (CARDIA)    Difficulty of Paying Living Expenses: Not hard at all  Food Insecurity: No Food Insecurity (11/06/2022)   Hunger Vital Sign    Worried About Running Out of Food in the Last Year: Never true    Ran Out of Food in the Last Year: Never true  Transportation Needs: No Transportation Needs (11/06/2022)   PRAPARE - Administrator, Civil Service (Medical): No    Lack of Transportation (Non-Medical): No  Physical Activity: Sufficiently Active  (10/12/2018)   Received from [facility]   Exercise Vital Sign    Days of Exercise per Week: 5 days    Minutes of Exercise per Session: 60 min  Stress: Stress Concern Present (10/12/2018)   Received from [facility] of Occupational Health - Occupational Stress Questionnaire    Feeling of Stress : Rather much  Social Connections: Not on file  Intimate Partner Violence: Not At Risk (11/06/2022)   Humiliation, Afraid, Rape, and Kick questionnaire    Fear of Current or Ex-Partner: No    Emotionally Abused: No    Physically Abused: No    Sexually Abused: No    Family History  Problem Relation Age of Onset   Healthy Mother    Healthy Father      Vitals:   11/06/22 2034 11/06/22 2344 11/07/22 0326 11/07/22 0801  BP: 121/80 116/79 115/80 138/89  Pulse: (!) 56 (!) 105 (!) 57 (!) 58  Resp: 17 16 17 19   Temp: 98.8 F (37.1 C) 97.7 F (36.5 C) 97.7 F (36.5 C) 97.6 F (36.4 C)  TempSrc: Oral Oral Oral   SpO2: 99% 100% 99% 96%  Weight:      Height:        PHYSICAL EXAM General: Well-appearing male, well nourished, in no acute distress sitting upright in ED stretcher. HEENT: Normocephalic and atraumatic. Neck: No JVD.  Lungs: Normal respiratory effort on room air. Clear bilaterally to auscultation. No wheezes, crackles, rhonchi.  Heart: HRRR. Normal S1 and S2 without gallops or murmurs.  Abdomen: Non-distended appearing.  Msk: Normal strength and tone for age. Extremities: Warm and well perfused. No clubbing, cyanosis. No edema.  Neuro: Alert and oriented X 3. Psych: Answers questions appropriately.   Labs: Basic Metabolic Panel: Recent Labs    11/05/22 1131  NA 136  K 4.0  CL 102  CO2 25  GLUCOSE 128*  BUN 22*  CREATININE 0.90  CALCIUM 9.1   Liver Function Tests: Recent Labs    11/05/22 1131  AST 27  ALT 22  ALKPHOS 56  BILITOT 0.6  PROT 7.1  ALBUMIN 4.3   No results for input(s): "LIPASE", "AMYLASE" in  the last 72 hours. CBC: Recent Labs    11/06/22 0424 11/07/22 0453  WBC 5.3 4.5  HGB 10.7* 10.6*  HCT 31.7* 31.0*  MCV 85.7 85.4  PLT 192 179   Cardiac Enzymes: Recent Labs    11/06/22 0424 11/06/22 0817 11/06/22 1058  TROPONINIHS 62* 44* 38*   BNP: No results for input(s): "  BNP" in the last 72 hours. D-Dimer: Recent Labs    11/06/22 0817  DDIMER 0.55*   Hemoglobin A1C: No results for input(s): "HGBA1C" in the last 72 hours. Fasting Lipid Panel: No results for input(s): "Christopher", "HDL", "LDLCALC", "TRIG", "CHOLHDL", "LDLDIRECT" in the last 72 hours. Thyroid Function Tests: Recent Labs    11/05/22 1332  TSH 0.902   Anemia Panel: No results for input(s): "VITAMINB12", "FOLATE", "FERRITIN", "TIBC", "IRON", "RETICCTPCT" in the last 72 hours.   Radiology: [facility] Chest Port 1 View  Result Date: 11/05/2022 CLINICAL DATA:  Chest pain and shortness of breath. EXAM: PORTABLE CHEST 1 VIEW COMPARISON:  None Available. FINDINGS: The cardiomediastinal silhouette is within normal limits. There is slight accentuation of the interstitial markings. Mild scarring is suspected in the right upper lobe. No overt pulmonary edema, segmental airspace consolidation, sizeable pleural effusion, or pneumothorax is identified. No acute osseous abnormality is seen. IMPRESSION: No active disease. Electronically Signed   By: Sebastian Ache M.D.   On: 11/05/2022 12:48    ECHO 03/2022:  MILD LV DYSFUNCTION EF 45-50%   NORMAL RIGHT VENTRICULAR SYSTOLIC FUNCTION    VALVULAR REGURGITATION: MILD AR, TRIVIAL MR, TRIVIAL PR, TRIVIAL TR    NO VALVULAR STENOSIS    DILATED AO SINUS (4.2cm)    BRADYCARDIC THROUGHOUT THE EXAM   TELEMETRY reviewed by me [facility]) 11/07/2022: sinus rhythm 70s, in the 50s overnight  EKG reviewed by me: sinus bradycardia rate 54 bpm, nonacute  Data reviewed by me [facility]) 11/07/2022: last 24h vitals tele labs imaging I/O ED provider note, admission H&P  Principal Problem:   Angina at  rest Active Problems:   CAD (coronary artery disease)   Unstable angina (HCC)   Essential hypertension   Hypotension    ASSESSMENT AND PLAN:  Christopher Beck is a 57 y.o. male  with a past medical history of CAD with STEMI (s/p PCI x3 to mid and distal RCA and LAD 12/2019) c/b VT/VF arrest, NSTEMI (04/2021 s/p DES to RCA), HTN, HLD, tobacco use who presented to the ED on 11/05/2022 for chest pain. Cardiology was consulted for further evaluation.   # Unstable angina # Coronary artery disease s/p DES to LAD (2021) and RCA (2023) Patient with history of CAD followed by [facility] cardiology presenting with episode of more persistent chest pain. Did not resolve with nitroglycerin at home. Recently saw cardiology who started imdur for coronary vasospasm. Troponins 4 > 15 > 62 > 44 > 38. EKG nonacute, stable from prior. Chest pain resolved, no further episodes per patient this AM. -Ranexa 500 mg twice daily, imdur 30 mg daily  -No plan for further cardiac diagnostics.   # Hypotension Patient received fluid bolus in the ED yesterday with mild improvement in BP. He is without dizziness, lightheadedness. BP much improved this AM. -Continue to monitor BP closely. Hold parameters in place for BP medications.   # Bradycardia Patient with HR in the 50s in the ED. Denies dizziness, lightheadedness. HR improved this AM but bradycardic overnight. -Metoprolol discontinued by primary. Consider restarting at follow up appointment.    Ok for discharge today from a cardiac perspective. Recommend follow up with primary cardiologist in 1-2 weeks.  This patient's plan of care was discussed and created with Dr. Melton Alar and she is in agreement.  Signed: Gale Journey, PA-C  11/07/2022, 10:50 AM [facility] Cardiology

## 2022-11-07 NOTE — Progress Notes (Signed)
Transition of Care [facility]) - Inpatient Brief Assessment   Patient Details  Name: Christopher Beck MRN: [id number] Date of Birth: 11-Mar-1965  Transition of Care [facility]) CM/SW Contact:    Truddie Hidden, RN Phone Number: 11/07/2022, 10:48 AM   Clinical Narrative: Transition of Care Department [facility]) has reviewed patient and no TOC needs have been identified at this time. We will continue to monitor patient advancement. If new patient transition needs arise, please place a TOC consult.       Transition of Care Asessment: Insurance and Status: Insurance coverage has been reviewed Patient has primary care physician: Yes Home environment has been reviewed: Return to home Prior level of function:: Independent Prior/Current Home Services: No current home services Social Determinants of Health Reivew: SDOH reviewed no interventions necessary Readmission risk has been reviewed: Yes Transition of care needs: no transition of care needs at this time \

## 2022-11-07 NOTE — ED Triage Notes (Signed)
Pt presents to the ED with [facility] from home. Pt was admitted here for a two day stay and was discharged about an hour ago. Pt states that when he got home he started having centralized CP while cleaning. Pt reports it felt like a "rock is in my chest". Pt also reports a HA that started after taking nitroglycerin at home. Pt took two sublingual nitroglycerins and had two nitroglycerin sprays with EMS. Pt also took 324 aspirin prior to EMS arrival.

## 2022-11-07 NOTE — ED Provider Notes (Signed)
[facility] Provider Note    Event Date/Time   First MD Initiated Contact with Patient 11/07/22 1412     (approximate)   History   Chest Pain   HPI  Christopher Beck is a 57 year old male with history of CAD, prior STEMI s/p RCA and LAD stenting, HTN, trigeminal neuralgia presenting to the ER for evaluation of chest pain.  Patient was just discharged from the hospital.  When he returned home he had recurrence of chest pain described as a pressure in the center of his chest.  Reports this is briefly improved with nitroglycerin, but recurs.  Does report that the nitroglycerin is not giving him a headache.  Took 2 sublingual nitroglycerin prior to presentation, had 2 nitroglycerin sprays with EMS.  Also received 324 of aspirin.  Reviewed discharge summary from earlier today.  Patient presented with chest pain.  He had a peak troponin of 62 with normal D-dimer for age.  He was placed on a heparin drip and seen by cardiology who thought it was secondary to coronary spasm and did not recommend repeat coronary angiogram.  I did also review his discharge summary from [facility] from January of this year. From discharge summary: "Patient with a history of CAD and STEMI s/p PCI x 3 c/b VT/VF arrest Presented with acute chest pain and hsTNI ~350. EKG w/o ST elevations or depressions. Positive troponinemia. LHC completed without signs of hemodynamically significant lesions in epicardial coronaries noted. Our hypotheses to explain elevated troponin include (a) uncontrolled hypertension engendered by chest pain from GERD with esophageal spasm (in the setting of incomplete coronary revascularization); (b) coronary artery spasm (triggered by cigarette smoking); (c) occlusion of a very small branch coronary artery. Supporting the esophageal spasm hypothesis were the response to nitroglycerin and the substantial increase in heartburn recently reported by the pt (who takes his esomeprazole only  prn heartburn, not regularly)."     Physical Exam   Triage Vital Signs: ED Triage Vitals  Encounter Vitals Group     BP 11/07/22 1353 (!) 118/93     Systolic BP Percentile --      Diastolic BP Percentile --      Pulse Rate 11/07/22 1353 94     Resp 11/07/22 1353 18     Temp 11/07/22 1353 98 F (36.7 C)     Temp Source 11/07/22 1353 Oral     SpO2 11/07/22 1351 99 %     Weight 11/07/22 1354 139 lb 15.9 oz (63.5 kg)     Height 11/07/22 1354 5\' 7"  (1.702 m)     Head Circumference --      Peak Flow --      Pain Score 11/07/22 1353 3     Pain Loc --      Pain Education --      Exclude from Growth Chart --     Most recent vital signs: Vitals:   11/07/22 1353 11/07/22 1450  BP: (!) 118/93 (!) 174/101  Pulse: 94 65  Resp: 18 13  Temp: 98 F (36.7 C)   SpO2: 99% 100%     General: Awake, interactive, appears somewhat uncomfortable CV:  Regular rate, good peripheral perfusion.  Resp:  Lungs clear, unlabored respirations.  Abd:  Soft, nondistended.  Neuro:  Symmetric facial movement, fluid speech   ED Results / Procedures / Treatments   Labs (all labs ordered are listed, but only abnormal results are displayed) Labs Reviewed  BASIC METABOLIC PANEL - Abnormal; Notable  for the following components:      Result Value   Glucose, Bld 104 (*)    Calcium 8.7 (*)    Anion gap 17 (*)    All other components within normal limits  CBC - Abnormal; Notable for the following components:   RBC 4.18 (*)    Hemoglobin 12.1 (*)    HCT 36.2 (*)    All other components within normal limits  TROPONIN I (HIGH SENSITIVITY)     EKG EKG independently reviewed interpreted by myself (ER attending) demonstrates:  EKG demonstrates normal sinus rhythm at a rate of 87, PR 176, QRS 92, QTc 459, subtle ST elevations in the inferior leads, similar to prior from 4//2024  RADIOLOGY Imaging independently reviewed and interpreted by myself demonstrates:  CXR without focal consolidation on my  review  PROCEDURES:  Critical Care performed: No  Procedures   MEDICATIONS ORDERED IN ED: Medications  morphine (PF) 4 MG/ML injection 4 mg (has no administration in time range)     IMPRESSION / MDM / ASSESSMENT AND PLAN / ED COURSE  I reviewed the triage vital signs and the nursing notes.  Differential diagnosis includes, but is not limited to, recurrent chest pain secondary to coronary vasospasm, ACS, unstable angina, pneumothorax, pneumonia  Patient's presentation is most consistent with acute presentation with potential threat to life or bodily function.  57 year old male presenting with recurrent chest pain after recent admission for the same.  Labs with stable anemia.  Troponin has normalized at 15.  Case was reviewed with Dr. Melton Alar with cardiology he was familiar with the patient from his recent admission.  She felt that patient would most benefit from outpatient evaluation and reports that she will arrange follow-up for Monday morning with the patient.  She recommended that patient continue his Imdur for the next few days to see if this improves his symptoms.  Discussed with patient.  He has some ongoing pain, but is comfortable with this plan as pain can be improved.  Ordered for dose of morphine.  Signed out to oncoming provider pending response to medication, discharge with close outpatient follow-up if pain improved.    FINAL CLINICAL IMPRESSION(S) / ED DIAGNOSES   Final diagnoses:  Acute chest pain     Rx / DC Orders   ED Discharge Orders     None        Note:  This document was prepared using Dragon voice recognition software and may include unintentional dictation errors.   Trinna Post, MD 11/07/22 1536

## 2022-11-07 NOTE — ED Notes (Signed)
Pt at xray

## 2022-11-10 NOTE — Group Note (Deleted)

## 2023-06-17 ENCOUNTER — Other Ambulatory Visit: Payer: Self-pay

## 2023-06-17 ENCOUNTER — Emergency Department

## 2023-06-17 ENCOUNTER — Emergency Department
Admission: EM | Admit: 2023-06-17 | Discharge: 2023-06-17 | Disposition: A | Discharge status: Home / Self Care | Attending: Emergency Medicine | Admitting: Emergency Medicine

## 2023-06-17 DIAGNOSIS — X501XXA Overexertion from prolonged static or awkward postures, initial encounter: Secondary | ICD-10-CM | POA: Insufficient documentation

## 2023-06-17 DIAGNOSIS — S22070A Wedge compression fracture of T9-T10 vertebra, initial encounter for closed fracture: Secondary | ICD-10-CM | POA: Diagnosis not present

## 2023-06-17 DIAGNOSIS — S29002A Unspecified injury of muscle and tendon of back wall of thorax, initial encounter: Secondary | ICD-10-CM | POA: Diagnosis present

## 2023-06-17 MED ORDER — MELOXICAM 15 MG PO TABS: 15.0000 mg | ORAL_TABLET | Freq: Every day | ORAL | 0 refills | Status: AC

## 2023-06-17 MED ORDER — LIDOCAINE 5 % EX PTCH
1.0000 | MEDICATED_PATCH | CUTANEOUS | Status: DC
  Administered 2023-06-17: 1 via TRANSDERMAL
  Filled 2023-06-17: qty 1

## 2023-06-17 MED ORDER — KETOROLAC TROMETHAMINE 30 MG/ML IJ SOLN: 30.0000 mg | Freq: Once | INTRAMUSCULAR | Status: DC

## 2023-06-17 MED ORDER — ONDANSETRON HCL 4 MG/2ML IJ SOLN
4.0000 mg | Freq: Once | INTRAMUSCULAR | Status: AC
  Administered 2023-06-17: 4 mg via INTRAVENOUS
  Filled 2023-06-17: qty 2

## 2023-06-17 MED ORDER — CYCLOBENZAPRINE HCL 5 MG PO TABS: 5.0000 mg | ORAL_TABLET | Freq: Three times a day (TID) | ORAL | 0 refills | Status: AC | PRN

## 2023-06-17 MED ORDER — KETOROLAC TROMETHAMINE 30 MG/ML IJ SOLN
30.0000 mg | Freq: Once | INTRAMUSCULAR | Status: AC
  Administered 2023-06-17: 30 mg via INTRAVENOUS
  Filled 2023-06-17: qty 1

## 2023-06-17 MED ORDER — MORPHINE SULFATE (PF) 2 MG/ML IV SOLN
2.0000 mg | Freq: Once | INTRAVENOUS | Status: AC
  Administered 2023-06-17: 2 mg via INTRAVENOUS
  Filled 2023-06-17: qty 1

## 2023-06-17 MED ORDER — CYCLOBENZAPRINE HCL 10 MG PO TABS
5.0000 mg | ORAL_TABLET | Freq: Once | ORAL | Status: AC
  Administered 2023-06-17: 5 mg via ORAL
  Filled 2023-06-17: qty 1

## 2023-06-17 NOTE — ED Notes (Signed)
 Called ortho about the STAT order for the TLSO brace. They will be getting someone over this way ASAP.

## 2023-06-17 NOTE — Discharge Instructions (Addendum)
 You have been diagnosed with mild compression of T10, L1, L2 and moderate compression of T12.  Please take Flexeril 1 tablet by mouth 3 times a day as needed for pain after main meals.  Please take meloxicam 1 tablet by mouth daily.  Please call Dr. Ace Holder and make an appointment for a follow-up this week.  Come back to ED or go to your PCP if you have new symptoms or symptoms worsen

## 2023-06-17 NOTE — ED Provider Notes (Signed)
 ----------------------------------------- 6:50 PM on 06/17/2023 -----------------------------------------  Blood pressure 122/81, pulse 63, temperature 97.8 F (36.6 C), temperature source Oral, resp. rate 17, height 5\' 7"  (1.702 m), weight 65.8 kg, SpO2 100%.  Assuming care from [facility], PA-C/NP-C.  In short, Christopher Beck is a 58 y.o. male with a chief complaint of No chief complaint on file. Christopher Beck  Refer to the original H&P for additional details.  The current plan of care is to reassess patient after having the brace on evaluate if the patient is able to walk.  To make decision of disposition.  ____________________________________________    ED Results / Procedures / Treatments   Labs (all labs ordered are listed, but only abnormal results are displayed) Labs Reviewed - No data to display   EKG     RADIOLOGY  I personally viewed and evaluated these images as part of my medical decision making, as well as reviewing the written report by the radiologist.  ED Provider Interpretation:   DG Lumbar Spine Complete Result Date: 06/17/2023 CLINICAL DATA:  Pain. EXAM: LUMBAR SPINE - COMPLETE 4+ VIEW COMPARISON:  CTA abdomen and pelvis 08/13/2020 FINDINGS: The bones are osteopenic. There are mild compression deformities of the superior endplates of T10, L1 and L2 and moderate compression deformity of T12. These are all new findings. Alignment is anatomic. Disc spaces are maintained. IMPRESSION: Mild compression deformities of the superior endplates of T10, L1 and L2 and moderate compression deformity of T12. These are all new findings. Correlate clinically for acuity. Electronically Signed   By: Darliss Cheney M.D.   On: 06/17/2023 17:34     PROCEDURES:  Critical Care performed: No  Procedures   MEDICATIONS ORDERED IN ED: Medications  lidocaine (LIDODERM) 5 % 1 patch (1 patch Transdermal Patch Applied 06/17/23 1412)  cyclobenzaprine (FLEXERIL) tablet 5 mg (5 mg Oral Given  06/17/23 1406)  ketorolac (TORADOL) 30 MG/ML injection 30 mg (30 mg Intravenous Given 06/17/23 1408)  morphine (PF) 2 MG/ML injection 2 mg (2 mg Intravenous Given 06/17/23 1740)  ondansetron (ZOFRAN) injection 4 mg (4 mg Intravenous Given 06/17/23 1739)     IMPRESSION / MDM / ASSESSMENT AND PLAN / ED COURSE  I reviewed the triage vital signs and the nursing notes.                              Differential diagnosis includes, but is not limited to, fracture, stenosis, muscle strain  Patient's presentation is most consistent with acute complicated illness / injury requiring diagnostic workup. Patient's diagnosis is consistent with T10, L1 and L2 moderate compression. Patient will be discharged home with prescriptions for Flexeril, Mobic. Patient is to follow up with Dr. Apolinar Junes as needed or otherwise directed. Patient is given ED precautions to return to the ED for any worsening or new symptoms.  Clinical Course as of 06/17/23 2019  Wed Jun 17, 2023  1508 On reassessment patient has significant improvement.  [MH]  1806 DG Lumbar Spine Complete IMPRESSION: Mild compression deformities of the superior endplates of T10, L1 and L2 and moderate compression deformity of T12.  [MH]  1825 Neurosurgery consulted.  Discussed with Dr. Katrinka Blazing who will closely follow-up with the patient outpatient if ambulatory. recommend TLSO bracing until f/u [MH]  2016 DG Lumbar Spine Complete Mild compression deformities of the superior endplates of T10, L1 and L2 and moderate compression deformity of T12. These are all new findings. Correlate clinically for acuity.   [  AE]  2016 Patient the brace, states he feels better and is able to walk.  Patient will be discharged home with follow-up with orthopedics and pain medicine. [AE]    Clinical Course User Index [AE] [name], PA-C [MH] Phyllis Breeze, [place] A, PA-C    FINAL CLINICAL IMPRESSION(S) / ED DIAGNOSES   Final diagnoses:  Compression fracture of T10  vertebra, initial encounter (HCC)     Rx / DC Orders   ED Discharge Orders          Ordered    cyclobenzaprine (FLEXERIL) 5 MG tablet  3 times daily PRN        06/17/23 1915    meloxicam (MOBIC) 15 MG tablet  Daily        06/17/23 1915             Note:  This document was prepared using Dragon voice recognition software and may include unintentional dictation errors.     [name], PA-C 06/17/23 2023    [name], MD 06/17/23 2027

## 2023-06-17 NOTE — ED Notes (Signed)
 Pt ambulated to the bathroom and back without incident.

## 2023-06-17 NOTE — ED Notes (Signed)
 Pt given crackers and water.

## 2023-06-17 NOTE — ED Provider Notes (Signed)
 [facility] Emergency Department Provider Note     Event Date/Time   First MD Initiated Contact with Patient 06/17/23 1321     (approximate)   History   No chief complaint on file.   HPI  Christopher Beck is a 58 y.o. male with a history of chronic back pain presents to the ED with complaint of mid lower back pain after picking up a a bucket full of water while playing with his grandson.  Patient reports he felt a "pop" and now has severe left sided mid back pain.  No radiation.  He denies loss of bladder or bowel control and saddle anesthesia.  No other complaint.    Physical Exam   Triage Vital Signs: ED Triage Vitals  Encounter Vitals Group     BP 06/17/23 1310 109/70     Systolic BP Percentile --      Diastolic BP Percentile --      Pulse Rate 06/17/23 1310 69     Resp 06/17/23 1310 18     Temp 06/17/23 1310 97.7 F (36.5 C)     Temp Source 06/17/23 1310 Oral     SpO2 06/17/23 1310 100 %     Weight 06/17/23 1304 145 lb (65.8 kg)     Height 06/17/23 1304 5\' 7"  (1.702 m)     Head Circumference --      Peak Flow --      Pain Score 06/17/23 1304 6     Pain Loc --      Pain Education --      Exclude from Growth Chart --     Most recent vital signs: Vitals:   06/17/23 1310 06/17/23 1745  BP: 109/70 122/81  Pulse: 69 63  Resp: 18 17  Temp: 97.7 F (36.5 C) 97.8 F (36.6 C)  SpO2: 100% 100%    General: Obvious discomfort. Alert and oriented. INAD.  Head:  NCAT.  Neck:   No cervical spine tenderness to palpation.  CV:  Good peripheral perfusion. No peripheral edema.  RESP:  Normal effort.  ABD:  No distention.  BACK:  Spinous process is midline without deformity or tenderness over cervical and thoracic region.  Mild tenderness to midline L1 and left paraspinal muscles.  MSK:   Full ROM in all joints. No swelling, deformity or tenderness.  NEURO: Cranial nerves II-XII intact. No focal deficits. Sensation and motor function intact.  5/5 LE strength    ED Results / Procedures / Treatments   Labs (all labs ordered are listed, but only abnormal results are displayed) Labs Reviewed - No data to display  RADIOLOGY  I personally viewed and evaluated these images as part of my medical decision making, as well as reviewing the written report by the radiologist.  ED Provider Interpretation: Acute mild compression of T10, L1 and L2  DG Lumbar Spine Complete Result Date: 06/17/2023 CLINICAL DATA:  Pain. EXAM: LUMBAR SPINE - COMPLETE 4+ VIEW COMPARISON:  CTA abdomen and pelvis 08/13/2020 FINDINGS: The bones are osteopenic. There are mild compression deformities of the superior endplates of T10, L1 and L2 and moderate compression deformity of T12. These are all new findings. Alignment is anatomic. Disc spaces are maintained. IMPRESSION: Mild compression deformities of the superior endplates of T10, L1 and L2 and moderate compression deformity of T12. These are all new findings. Correlate clinically for acuity. Electronically Signed   By: Darliss Cheney M.D.   On: 06/17/2023 17:34  PROCEDURES:  Critical Care performed: No  Procedures  MEDICATIONS ORDERED IN ED: Medications  lidocaine (LIDODERM) 5 % 1 patch (1 patch Transdermal Patch Applied 06/17/23 1412)  cyclobenzaprine (FLEXERIL) tablet 5 mg (5 mg Oral Given 06/17/23 1406)  ketorolac (TORADOL) 30 MG/ML injection 30 mg (30 mg Intravenous Given 06/17/23 1408)  morphine (PF) 2 MG/ML injection 2 mg (2 mg Intravenous Given 06/17/23 1740)  ondansetron (ZOFRAN) injection 4 mg (4 mg Intravenous Given 06/17/23 1739)   IMPRESSION / MDM / ASSESSMENT AND PLAN / ED COURSE  I reviewed the triage vital signs and the nursing notes.                             Clinical Course as of 06/17/23 1917  Wed Jun 17, 2023  1508 On reassessment patient has significant improvement.  [MH]  1806 DG Lumbar Spine Complete IMPRESSION: Mild compression deformities of the superior endplates of T10, L1  and L2 and moderate compression deformity of T12.  [MH]  1825 Neurosurgery consulted.  Discussed with Dr. Felipe Horton who will closely follow-up with the patient outpatient if ambulatory. recommend TLSO bracing until f/u [MH]    Clinical Course User Index [MH] Billye Buerger, PA-C   58 y.o. male presents to the emergency department for evaluation and treatment of back pain. See HPI for further details.   Differential diagnosis includes, but is not limited to fracture, herniated disc, muscle spasm/strain.   Patient's presentation is most consistent with acute complicated illness / injury requiring diagnostic workup.  X-rays obtained in triage.  Pain management with IV Toradol, Toradol and lidocaine patch.  Patient reporting pain returned prior to x-ray read.  Will administer IV morphine and Zofran for pain control.  ----------------------------------------- 7:10 PM on 06/17/2023 ----------------------------------------- X-ray reveals multiple compression fractures of T10, L1, L2.  Patient will be placed in TLSO brace.  If ambulatory can be discharged with close follow-up with neurosurgery.  Sending prescription for Flexeril and meloxicam.  Patient takes Suboxone twice a day.  Transferring care to Luster Salters, PA-C who will ambulate the patient and make appropriate disposition.  At this time patient is stable and comfortable.    FINAL CLINICAL IMPRESSION(S) / ED DIAGNOSES   Final diagnoses:  Compression fracture of T10 vertebra, initial encounter (HCC)   Rx / DC Orders   ED Discharge Orders          Ordered    cyclobenzaprine (FLEXERIL) 5 MG tablet  3 times daily PRN        06/17/23 1915    meloxicam (MOBIC) 15 MG tablet  Daily        06/17/23 1915           Note:  This document was prepared using Dragon voice recognition software and may include unintentional dictation errors.    Phyllis Breeze, Lonzy M[place] A, PA-C 06/17/23 1917    Bradler, Evan K, MD 06/18/23 [phone]

## 2023-06-17 NOTE — ED Notes (Signed)
 Brace applied by [facility] staff.

## 2023-06-17 NOTE — ED Triage Notes (Signed)
 Pt here via [facility] from home with c/o of sudden onset mid line back pain that started after bending over to pick up bucket, 1 gallon of water in bucket. Pt states he felt something pop in his lower back, pt has chronic back pain.   20 R AC  1000mg  of IV tylenol given by EMS   113/47 HR:67 100% RA.

## 2023-06-17 NOTE — Progress Notes (Signed)
 Orthopedic Tech Progress Note Patient Details:  Christopher Beck [birth year] [id number]  Called in order to [facility] for a STAT TLSO   Patient ID: Christopher Beck, male   DOB: 12-30-1965, 58 y.o.   MRN: [id number]  [name] 06/17/2023, 6:31 PM

## 2023-07-09 NOTE — Progress Notes (Deleted)
 Referring Physician:  Mel Spine, MD [phone] [address] [facility] [address] [place],  [place] [address]  Primary Physician:  Mel Spine, MD  History of Present Illness: 07/09/2023*** [name] has a history of CAD, HTN, prediabetes, NSTEMI, GERD.   Seen in ED on 06/17/23 with increased back pain after picking up a bucket of water and feeling a pop. Found to have compression fractures of T10, T12, L1, and L2. Was placed in TLSO.       He take suboxone  (Dr. Felipe Horton).  He is also taking neurontin  and tylenol .   Given flexeril  and mobic  from ED.   Duration: *** Location: *** Quality: *** Severity: ***  Precipitating: aggravated by *** Modifying factors: made better by *** Weakness: none Timing: *** Bowel/Bladder Dysfunction: none  Conservative measures:  Physical therapy: ***  Multimodal medical therapy including regular antiinflammatories: suboxone , tylenol , neurontin   Injections: *** epidural steroid injections   Past Surgery: ***no spinal surgeries   Christopher Beck has ***no symptoms of cervical myelopathy.  The symptoms are causing a significant impact on the patient's life.   Review of Systems:  A 10 point review of systems is negative, except for the pertinent positives and negatives detailed in the HPI.  Past Medical History: Past Medical History:  Diagnosis Date   Hypertension    Lumbar stress fracture     Past Surgical History: Past Surgical History:  Procedure Laterality Date   FRACTURE SURGERY      Allergies: Allergies as of 07/13/2023   (No Known Allergies)    Medications: Outpatient Encounter Medications as of 07/13/2023  Medication Sig   amLODipine  (NORVASC ) 10 MG tablet Take 10 mg by mouth daily.   aspirin  81 MG chewable tablet Chew 81 mg by mouth daily.   clopidogrel  (PLAVIX ) 75 MG tablet Take 75 mg by mouth daily.   cyclobenzaprine  (FLEXERIL ) 5 MG tablet Take 1 tablet (5 mg total) by mouth 3 (three) times daily as needed.    ezetimibe (ZETIA) 10 MG tablet Take 1 tablet by mouth daily.   gabapentin  (NEURONTIN ) 600 MG tablet Take 600 mg by mouth 3 (three) times daily.   isosorbide  mononitrate (IMDUR ) 30 MG 24 hr tablet Take 30 mg by mouth daily.   meloxicam  (MOBIC ) 15 MG tablet Take 1 tablet (15 mg total) by mouth daily.   nicotine  (NICODERM CQ  - DOSED IN MG/24 HOURS) 14 mg/24hr patch Place 14 mg onto the skin daily.   pantoprazole  (PROTONIX ) 40 MG tablet Take 1 tablet (40 mg total) by mouth daily.   ranolazine  (RANEXA ) 500 MG 12 hr tablet Take 1 tablet (500 mg total) by mouth 2 (two) times daily.   rosuvastatin  (CRESTOR ) 40 MG tablet Take 40 mg by mouth daily.   SUBOXONE  8-2 MG FILM Place 1 Film under the tongue 2 (two) times daily.   [DISCONTINUED] albuterol  (PROVENTIL  HFA;VENTOLIN  HFA) 108 (90 Base) MCG/ACT inhaler Inhale 1-2 puffs into the lungs every 6 (six) hours as needed for wheezing or shortness of breath.   No facility-administered encounter medications on file as of 07/13/2023.    Social History: Social History   Tobacco Use   Smoking status: Former    Current packs/day: 0.00    Types: Cigarettes    Quit date: 04/01/2018    Years since quitting: 5.2   Smokeless tobacco: Never  Vaping Use   Vaping status: Some Days  Substance Use Topics   Alcohol  use: No   Drug use: Not Currently    Family Medical  History: Family History  Problem Relation Age of Onset   Healthy Mother    Healthy Father     Physical Examination: There were no vitals filed for this visit.  General: Patient is well developed, well nourished, calm, collected, and in no apparent distress. Attention to examination is appropriate.  Respiratory: Patient is breathing without any difficulty.   NEUROLOGICAL:     Awake, alert, oriented to person, place, and time.  Speech is clear and fluent. Fund of knowledge is appropriate.   Cranial Nerves: Pupils equal round and reactive to light.  Facial tone is symmetric.    *** ROM of  cervical spine *** pain *** posterior cervical tenderness. *** tenderness in bilateral trapezial region.   *** ROM of lumbar spine *** pain *** posterior lumbar tenderness.   No abnormal lesions on exposed skin.   Strength: Side Biceps Triceps Deltoid Interossei Grip Wrist Ext. Wrist Flex.  R 5 5 5 5 5 5 5   L 5 5 5 5 5 5 5    Side Iliopsoas Quads Hamstring PF DF EHL  R 5 5 5 5 5 5   L 5 5 5 5 5 5    Reflexes are ***2+ and symmetric at the biceps, brachioradialis, patella and achilles.   Hoffman's is absent.  Clonus is not present.   Bilateral upper and lower extremity sensation is intact to light touch.     Gait is normal.   ***No difficulty with tandem gait.    Medical Decision Making  Imaging: Lumbar xrays dated ***:  ***  Report for above xrays not yet available.   Lumbar xrays dated 06/17/23:  FINDINGS: The bones are osteopenic. There are mild compression deformities of the superior endplates of T10, L1 and L2 and moderate compression deformity of T12. These are all new findings. Alignment is anatomic. Disc spaces are maintained.   IMPRESSION: Mild compression deformities of the superior endplates of T10, L1 and L2 and moderate compression deformity of T12. These are all new findings. Correlate clinically for acuity.     Electronically Signed   By: Tyron Gallon M.D.   On: 06/17/2023 17:34  I have personally reviewed the images and agree with the above interpretation.  Assessment and Plan: [name] is a pleasant 58 y.o. male has ***  Treatment options discussed with patient and following plan made:   - Order for physical therapy for *** spine ***. Patient to call to schedule appointment. *** - Continue current medications including ***. Reviewed dosing and side effects.  - Prescription for ***. Reviewed dosing and side effects. Take with food.  - Prescription for *** to take prn muscle spasms. Reviewed dosing and side effects. Discussed this can cause  drowsiness.  - MRI of *** to further evaluate *** radiculopathy. No improvement time or medications (***).  - Referral to PMR at [facility] to discuss possible *** injections.  - Will schedule phone visit to review MRI results once I get them back.   I spent a total of *** minutes in face-to-face and non-face-to-face activities related to this patient's care today including review of outside records, review of imaging, review of symptoms, physical exam, discussion of differential diagnosis, discussion of treatment options, and documentation.   Thank you for involving me in the care of this patient.   Lucetta Russel PA-C Dept. of Neurosurgery

## 2023-07-10 ENCOUNTER — Other Ambulatory Visit: Payer: Self-pay | Admitting: Orthopedic Surgery

## 2023-07-10 DIAGNOSIS — S22080A Wedge compression fracture of T11-T12 vertebra, initial encounter for closed fracture: Secondary | ICD-10-CM

## 2023-07-10 DIAGNOSIS — S32020A Wedge compression fracture of second lumbar vertebra, initial encounter for closed fracture: Secondary | ICD-10-CM

## 2023-07-10 DIAGNOSIS — S22070A Wedge compression fracture of T9-T10 vertebra, initial encounter for closed fracture: Secondary | ICD-10-CM

## 2023-07-10 DIAGNOSIS — S32010A Wedge compression fracture of first lumbar vertebra, initial encounter for closed fracture: Secondary | ICD-10-CM

## 2023-07-13 ENCOUNTER — Ambulatory Visit: Admitting: Orthopedic Surgery
